# Patient Record
Sex: Male | Born: 1950 | Race: White | Hispanic: No | Marital: Married | Smoking: Never smoker
Health system: Southern US, Community
[De-identification: ages and names within clinical notes are randomized; demographics above are authoritative.]

## PROBLEM LIST (undated history)

## (undated) DIAGNOSIS — M1990 Unspecified osteoarthritis, unspecified site: Secondary | ICD-10-CM

## (undated) DIAGNOSIS — R0600 Dyspnea, unspecified: Secondary | ICD-10-CM

## (undated) DIAGNOSIS — I890 Lymphedema, not elsewhere classified: Secondary | ICD-10-CM

## (undated) DIAGNOSIS — K219 Gastro-esophageal reflux disease without esophagitis: Secondary | ICD-10-CM

## (undated) DIAGNOSIS — J45909 Unspecified asthma, uncomplicated: Secondary | ICD-10-CM

## (undated) DIAGNOSIS — K5790 Diverticulosis of intestine, part unspecified, without perforation or abscess without bleeding: Secondary | ICD-10-CM

## (undated) DIAGNOSIS — M7040 Prepatellar bursitis, unspecified knee: Secondary | ICD-10-CM

## (undated) DIAGNOSIS — J3089 Other allergic rhinitis: Secondary | ICD-10-CM

## (undated) DIAGNOSIS — E119 Type 2 diabetes mellitus without complications: Secondary | ICD-10-CM

## (undated) DIAGNOSIS — G4730 Sleep apnea, unspecified: Secondary | ICD-10-CM

## (undated) DIAGNOSIS — E291 Testicular hypofunction: Secondary | ICD-10-CM

## (undated) DIAGNOSIS — I10 Essential (primary) hypertension: Secondary | ICD-10-CM

## (undated) HISTORY — PX: JOINT REPLACEMENT: SHX530

## (undated) HISTORY — PX: ANKLE SURGERY: SHX546

## (undated) HISTORY — PX: EYE SURGERY: SHX253

## (undated) HISTORY — PX: LEG SURGERY: SHX1003

---

## 2009-08-29 ENCOUNTER — Ambulatory Visit: Payer: Self-pay | Admitting: Gastroenterology

## 2009-10-30 ENCOUNTER — Ambulatory Visit: Payer: Self-pay

## 2013-01-16 ENCOUNTER — Ambulatory Visit: Payer: Self-pay | Admitting: Family Medicine

## 2013-01-27 ENCOUNTER — Ambulatory Visit: Payer: Self-pay | Admitting: Family Medicine

## 2013-02-26 ENCOUNTER — Ambulatory Visit: Payer: Self-pay | Admitting: Family Medicine

## 2013-06-29 ENCOUNTER — Other Ambulatory Visit: Payer: Self-pay

## 2013-06-29 LAB — CBC WITH DIFFERENTIAL/PLATELET
Basophil #: 0.1 10*3/uL (ref 0.0–0.1)
Basophil %: 1 %
EOS ABS: 0.3 10*3/uL (ref 0.0–0.7)
EOS PCT: 4.1 %
HCT: 39.4 % — AB (ref 40.0–52.0)
HGB: 13.3 g/dL (ref 13.0–18.0)
LYMPHS PCT: 21.6 %
Lymphocyte #: 1.5 10*3/uL (ref 1.0–3.6)
MCH: 36 pg — ABNORMAL HIGH (ref 26.0–34.0)
MCHC: 33.8 g/dL (ref 32.0–36.0)
MCV: 107 fL — ABNORMAL HIGH (ref 80–100)
MONO ABS: 0.6 x10 3/mm (ref 0.2–1.0)
Monocyte %: 9.3 %
NEUTROS ABS: 4.4 10*3/uL (ref 1.4–6.5)
Neutrophil %: 64 %
Platelet: 289 10*3/uL (ref 150–440)
RBC: 3.7 10*6/uL — ABNORMAL LOW (ref 4.40–5.90)
RDW: 13.1 % (ref 11.5–14.5)
WBC: 6.8 10*3/uL (ref 3.8–10.6)

## 2013-06-29 LAB — LIPID PANEL
Cholesterol: 140 mg/dL (ref 0–200)
HDL Cholesterol: 53 mg/dL (ref 40–60)
Ldl Cholesterol, Calc: 70 mg/dL (ref 0–100)
TRIGLYCERIDES: 86 mg/dL (ref 0–200)
VLDL Cholesterol, Calc: 17 mg/dL (ref 5–40)

## 2013-06-29 LAB — COMPREHENSIVE METABOLIC PANEL
ANION GAP: 5 — AB (ref 7–16)
Albumin: 3.9 g/dL (ref 3.4–5.0)
Alkaline Phosphatase: 98 U/L
BILIRUBIN TOTAL: 0.5 mg/dL (ref 0.2–1.0)
BUN: 21 mg/dL — ABNORMAL HIGH (ref 7–18)
CHLORIDE: 105 mmol/L (ref 98–107)
CO2: 27 mmol/L (ref 21–32)
Calcium, Total: 9.1 mg/dL (ref 8.5–10.1)
Creatinine: 0.86 mg/dL (ref 0.60–1.30)
EGFR (African American): >60
EGFR (Non-African Amer.): >60
Glucose: 106 mg/dL — ABNORMAL HIGH (ref 65–99)
OSMOLALITY: 277 (ref 275–301)
POTASSIUM: 3.6 mmol/L (ref 3.5–5.1)
SGOT(AST): 26 U/L (ref 15–37)
SGPT (ALT): 44 U/L (ref 12–78)
SODIUM: 137 mmol/L (ref 136–145)
Total Protein: 7.5 g/dL (ref 6.4–8.2)

## 2013-06-29 LAB — HEMOGLOBIN A1C: HEMOGLOBIN A1C: 6.1 % (ref 4.2–6.3)

## 2014-05-15 ENCOUNTER — Ambulatory Visit: Payer: Self-pay | Admitting: Family Medicine

## 2014-09-20 ENCOUNTER — Ambulatory Visit: Payer: 59 | Admitting: Anesthesiology

## 2014-09-20 ENCOUNTER — Encounter: Payer: Self-pay | Admitting: *Deleted

## 2014-09-20 ENCOUNTER — Ambulatory Visit
Admission: RE | Admit: 2014-09-20 | Discharge: 2014-09-20 | Disposition: A | Discharge status: Home / Self Care | Payer: 59 | Source: Ambulatory Visit | Attending: Gastroenterology | Admitting: Gastroenterology

## 2014-09-20 ENCOUNTER — Encounter
Admission: RE | Disposition: A | Discharge status: Home / Self Care | Payer: Self-pay | Source: Ambulatory Visit | Attending: Gastroenterology

## 2014-09-20 DIAGNOSIS — D125 Benign neoplasm of sigmoid colon: Secondary | ICD-10-CM | POA: Diagnosis not present

## 2014-09-20 DIAGNOSIS — Z1211 Encounter for screening for malignant neoplasm of colon: Secondary | ICD-10-CM | POA: Insufficient documentation

## 2014-09-20 DIAGNOSIS — Z79899 Other long term (current) drug therapy: Secondary | ICD-10-CM | POA: Diagnosis not present

## 2014-09-20 DIAGNOSIS — K5730 Diverticulosis of large intestine without perforation or abscess without bleeding: Secondary | ICD-10-CM | POA: Insufficient documentation

## 2014-09-20 DIAGNOSIS — Z7982 Long term (current) use of aspirin: Secondary | ICD-10-CM | POA: Diagnosis not present

## 2014-09-20 DIAGNOSIS — E119 Type 2 diabetes mellitus without complications: Secondary | ICD-10-CM | POA: Insufficient documentation

## 2014-09-20 DIAGNOSIS — K635 Polyp of colon: Secondary | ICD-10-CM | POA: Diagnosis not present

## 2014-09-20 DIAGNOSIS — I10 Essential (primary) hypertension: Secondary | ICD-10-CM | POA: Insufficient documentation

## 2014-09-20 HISTORY — DX: Essential (primary) hypertension: I10

## 2014-09-20 HISTORY — PX: COLONOSCOPY WITH PROPOFOL: SHX5780

## 2014-09-20 HISTORY — DX: Type 2 diabetes mellitus without complications: E11.9

## 2014-09-20 LAB — GLUCOSE, CAPILLARY: Glucose-Capillary: 90 mg/dL (ref 65–99)

## 2014-09-20 SURGERY — COLONOSCOPY WITH PROPOFOL
Anesthesia: General

## 2014-09-20 MED ORDER — PHENYLEPHRINE HCL 10 MG/ML IJ SOLN
INTRAMUSCULAR | Status: DC | PRN
  Administered 2014-09-20 (×4): 100 ug via INTRAVENOUS

## 2014-09-20 MED ORDER — LIDOCAINE HCL (CARDIAC) 20 MG/ML IV SOLN
INTRAVENOUS | Status: DC | PRN
  Administered 2014-09-20: 60 mg via INTRAVENOUS

## 2014-09-20 MED ORDER — EPHEDRINE SULFATE 50 MG/ML IJ SOLN
INTRAMUSCULAR | Status: DC | PRN
  Administered 2014-09-20: 5 mg via INTRAVENOUS

## 2014-09-20 MED ORDER — PROPOFOL INFUSION 10 MG/ML OPTIME
INTRAVENOUS | Status: DC | PRN
  Administered 2014-09-20: 140 ug/kg/min via INTRAVENOUS

## 2014-09-20 MED ORDER — PROPOFOL 10 MG/ML IV BOLUS
INTRAVENOUS | Status: DC | PRN
  Administered 2014-09-20: 50 mg via INTRAVENOUS

## 2014-09-20 MED ORDER — SODIUM CHLORIDE 0.9 % IV SOLN: INTRAVENOUS | Status: DC

## 2014-09-20 MED ORDER — SODIUM CHLORIDE 0.9 % IV SOLN
INTRAVENOUS | Status: DC
  Administered 2014-09-20: 08:00:00 via INTRAVENOUS

## 2014-09-20 NOTE — H&P (Signed)
Outpatient short stay form Pre-procedure 09/20/2014 8:08 AM Lollie Sails MD  Primary Physician: [facility] [place]  Reason for visit:  Colonoscopy  History of present illness:  Personal history colon polyps. Patient is 63 year old male presenting today for colonoscopy. His last colonoscopy was 08/29/2009 that time with adenomatous colon polyp removed. No family history of colon cancer. He does not take any anticoagulation medications and discontinue his aspirin's several days ago.    Current facility-administered medications:  .  0.9 %  sodium chloride infusion, , Intravenous, Continuous, Lollie Sails, MD .  0.9 %  sodium chloride infusion, , Intravenous, Continuous, Lollie Sails, MD  Prescriptions prior to admission  Medication Sig Dispense Refill Last Dose  . albuterol (PROVENTIL HFA;VENTOLIN HFA) 108 (90 BASE) MCG/ACT inhaler Inhale 2 puffs into the lungs every 6 (six) hours as needed for wheezing or shortness of breath.   09/19/2014  . amLODipine-atorvastatin (CADUET) 5-20 MG per tablet Take 1 tablet by mouth daily.   09/20/2014 at 0200  . aspirin EC 81 MG tablet Take 81 mg by mouth daily.   09/19/2014  . budesonide-formoterol (SYMBICORT) 160-4.5 MCG/ACT inhaler Inhale 2 puffs into the lungs 2 (two) times daily.   09/19/2014  . lisinopril-hydrochlorothiazide (PRINZIDE,ZESTORETIC) 20-12.5 MG per tablet Take 1 tablet by mouth daily.   09/19/2014  . metFORMIN (GLUMETZA) 500 MG (MOD) 24 hr tablet Take 1,500 mg by mouth every evening.   09/18/2014  . Multiple Vitamin (MULTIVITAMIN) tablet Take 1 tablet by mouth daily.   09/19/2014  . Omega 3 1000 MG CAPS Take 1 capsule by mouth 2 (two) times daily.   09/19/2014  . omeprazole (PRILOSEC) 20 MG capsule Take 20 mg by mouth daily.   09/19/2014  . Testosterone (ANDROGEL PUMP) 20.25 MG/ACT (1.62%) GEL Place 1 Dose onto the skin daily.   09/19/2014     Allergies  Allergen Reactions  . Sulfa Antibiotics Rash    'flu-like'  symptoms     Past Medical History  Diagnosis Date  . Hypertension   . Diabetes mellitus without complication     Review of systems:      Physical Exam    Heart and lungs: Regular rate and rhythm without rub or gallop lungs are bilaterally clear    HEENT: Normocephalic atraumatic eyes are anicteric    Other:     Pertinant exam for procedure: Soft nontender nondistended bowel sounds positive normoactive active. Slight protuberant    Planned proceedures: Colonoscopy and indicated procedures I have discussed the risks benefits and complications of procedures to include not limited to bleeding, infection, perforation and the risk of sedation and the patient wishes to proceed.    Lollie Sails, MD Gastroenterology 09/20/2014  8:08 AM

## 2014-09-20 NOTE — Transfer of Care (Signed)
Immediate Anesthesia Transfer of Care Note  Patient: Benjamin Nicholson  Procedure(s) Performed: Procedure(s): COLONOSCOPY WITH PROPOFOL (N/A)  Patient Location: Endoscopy Unit  Anesthesia Type:General  Level of Consciousness: awake, alert , oriented and patient cooperative  Airway & Oxygen Therapy: Patient Spontanous Breathing and Patient connected to nasal cannula oxygen  Post-op Assessment: Report given to RN, Post -op Vital signs reviewed and stable and Patient moving all extremities X 4  Post vital signs: Reviewed and stable  Last Vitals:  Filed Vitals:   09/20/14 0857  BP: 132/74  Pulse: 88  Temp: 36.1 C  Resp: 19    Complications: No apparent anesthesia complications

## 2014-09-20 NOTE — Anesthesia Postprocedure Evaluation (Signed)
  Anesthesia Post-op Note  Patient: Benjamin Nicholson  Procedure(s) Performed: Procedure(s): COLONOSCOPY WITH PROPOFOL (N/A)  Anesthesia type:General  Patient location: PACU  Post pain: Pain level controlled  Post assessment: Post-op Vital signs reviewed, Patient's Cardiovascular Status Stable, Respiratory Function Stable, Patent Airway and No signs of Nausea or vomiting  Post vital signs: Reviewed and stable  Last Vitals:  Filed Vitals:   09/20/14 0859  BP: 132/74  Pulse:   Temp: 36.1 C  Resp: 16    Level of consciousness: awake, alert  and patient cooperative  Complications: No apparent anesthesia complications

## 2014-09-20 NOTE — Op Note (Signed)
[facility] Gastroenterology Patient Name: Benjamin Nicholson Procedure Date: 09/20/2014 7:52 AM MRN: [id number] Account #: [id number] Date of Birth: Nov 02, 1950 Admit Type: Outpatient Age: 64 Room: [facility] ENDO ROOM 4 Gender: Male Note Status: Finalized Procedure:         Colonoscopy Indications:       Personal history of colonic polyps Providers:         Lollie Sails, MD Referring MD:      Laurice Record. Manor, MD (Referring MD) Medicines:         Monitored Anesthesia Care Complications:     No immediate complications. Procedure:         Pre-Anesthesia Assessment:                    - ASA Grade Assessment: II - A patient with mild systemic                     disease.                    After obtaining informed consent, the colonoscope was                     passed under direct vision. Throughout the procedure, the                     patient's blood pressure, pulse, and oxygen saturations                     were monitored continuously. The Colonoscope was                     introduced through the anus and advanced to the the cecum,                     identified by appendiceal orifice and ileocecal valve. The                     colonoscopy was performed without difficulty. The patient                     tolerated the procedure well. The quality of the bowel                     preparation was good. Findings:      A 6 mm polyp was found at the appendiceal orifice. The polyp was       sessile. The polyp was removed with a cold biopsy forceps. The polyp was       removed with a piecemeal technique using a cold biopsy forceps. The       polyp was removed with a cold snare. The polyp was removed with a lift       and cut technique using a cold snare. Resection and retrieval were       complete.      A 2 mm polyp was found in the proximal sigmoid colon. The polyp was       sessile. The polyp was removed with a cold biopsy forceps. Resection and       retrieval were  complete.      Multiple small-mouthed diverticula were found in the sigmoid colon and       in the descending colon.      The retroflexed view of the distal rectum and anal verge was normal  and       showed no anal or rectal abnormalities.      The digital rectal exam was normal. Impression:        - One 6 mm polyp at the appendiceal orifice. Resected and                     retrieved.                    - One 2 mm polyp in the proximal sigmoid colon. Resected                     and retrieved.                    - Diverticulosis in the sigmoid colon and in the                     descending colon.                    - The distal rectum and anal verge are normal on                     retroflexion view. Recommendation:    - Discharge patient to home.                    - Await pathology results.                    - Telephone GI clinic for pathology results in 1 week. Procedure Code(s): --- Professional ---                    [phone], Colonoscopy, flexible; with removal of tumor(s),                     polyp(s), or other lesion(s) by snare technique                    45380, 42, Colonoscopy, flexible; with biopsy, single or                     multiple Diagnosis Code(s): --- Professional ---                    211.3, Benign neoplasm of colon                    V12.72, Personal history of colonic polyps                    562.10, Diverticulosis of colon (without mention of                     hemorrhage) CPT copyright 2014 American Medical Association. All rights reserved. The codes documented in this report are preliminary and upon coder review may  be revised to meet current compliance requirements. Lollie Sails, MD 09/20/2014 8:52:07 AM This report has been signed electronically. Number of Addenda: 0 Note Initiated On: 09/20/2014 7:52 AM Scope Withdrawal Time: 0 hours 21 minutes 20 seconds  Total Procedure Duration: 0 hours 27 minutes 7 seconds       [facility]

## 2014-09-20 NOTE — Anesthesia Preprocedure Evaluation (Addendum)
Anesthesia Evaluation  Patient identified by MRN, date of birth, ID band Patient awake    Reviewed: Allergy & Precautions, NPO status , Patient's Chart, lab work & pertinent test results  Airway Mallampati: II       Dental  (+) Caps Crown R upper back molar:   Pulmonary COPD COPD inhaler,  breath sounds clear to auscultation  Pulmonary exam normal       Cardiovascular hypertension, Normal cardiovascular exam    Neuro/Psych negative neurological ROS  negative psych ROS   GI/Hepatic GERD-  Medicated and Controlled,Hx of colon polyps   Endo/Other  diabetes, Well Controlled, Type 2, Oral Hypoglycemic Agents  Renal/GU negative Renal ROS  negative genitourinary   Musculoskeletal negative musculoskeletal ROS (+)   Abdominal Normal abdominal exam  (+)   Peds negative pediatric ROS (+)  Hematology negative hematology ROS (+)   Anesthesia Other Findings   Reproductive/Obstetrics                           Anesthesia Physical Anesthesia Plan  ASA: III  Anesthesia Plan: General   Post-op Pain Management:    Induction: Intravenous  Airway Management Planned: Nasal Cannula  Additional Equipment:   Intra-op Plan:   Post-operative Plan:   Informed Consent: I have reviewed the patients History and Physical, chart, labs and discussed the procedure including the risks, benefits and alternatives for the proposed anesthesia with the patient or authorized representative who has indicated his/her understanding and acceptance.   Dental advisory given  Plan Discussed with: CRNA and Surgeon  Anesthesia Plan Comments:         Anesthesia Quick Evaluation

## 2014-09-23 ENCOUNTER — Encounter: Payer: Self-pay | Admitting: Gastroenterology

## 2014-09-24 LAB — SURGICAL PATHOLOGY

## 2014-11-08 DIAGNOSIS — K219 Gastro-esophageal reflux disease without esophagitis: Secondary | ICD-10-CM | POA: Insufficient documentation

## 2014-11-08 DIAGNOSIS — E119 Type 2 diabetes mellitus without complications: Secondary | ICD-10-CM | POA: Insufficient documentation

## 2014-11-08 DIAGNOSIS — G479 Sleep disorder, unspecified: Secondary | ICD-10-CM | POA: Insufficient documentation

## 2014-11-08 DIAGNOSIS — J4530 Mild persistent asthma, uncomplicated: Secondary | ICD-10-CM | POA: Insufficient documentation

## 2014-11-08 DIAGNOSIS — I10 Essential (primary) hypertension: Secondary | ICD-10-CM | POA: Insufficient documentation

## 2014-11-08 DIAGNOSIS — E291 Testicular hypofunction: Secondary | ICD-10-CM | POA: Insufficient documentation

## 2015-04-18 ENCOUNTER — Ambulatory Visit: Payer: Self-pay | Admitting: Physician Assistant

## 2015-04-18 ENCOUNTER — Encounter: Payer: Self-pay | Admitting: Physician Assistant

## 2015-04-18 VITALS — BP 160/98 | HR 80 | Temp 98.5°F

## 2015-04-18 DIAGNOSIS — R21 Rash and other nonspecific skin eruption: Secondary | ICD-10-CM

## 2015-04-18 MED ORDER — METHYLPREDNISOLONE 4 MG PO TBPK: ORAL_TABLET | ORAL | Status: DC

## 2015-04-18 NOTE — Progress Notes (Signed)
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

## 2015-06-02 DIAGNOSIS — H524 Presbyopia: Secondary | ICD-10-CM | POA: Diagnosis not present

## 2015-06-02 DIAGNOSIS — E119 Type 2 diabetes mellitus without complications: Secondary | ICD-10-CM | POA: Diagnosis not present

## 2015-08-13 DIAGNOSIS — E291 Testicular hypofunction: Secondary | ICD-10-CM | POA: Diagnosis not present

## 2015-08-13 DIAGNOSIS — J4530 Mild persistent asthma, uncomplicated: Secondary | ICD-10-CM | POA: Diagnosis not present

## 2015-08-13 DIAGNOSIS — I10 Essential (primary) hypertension: Secondary | ICD-10-CM | POA: Diagnosis not present

## 2015-08-13 DIAGNOSIS — E119 Type 2 diabetes mellitus without complications: Secondary | ICD-10-CM | POA: Diagnosis not present

## 2015-10-13 DIAGNOSIS — M25562 Pain in left knee: Secondary | ICD-10-CM | POA: Insufficient documentation

## 2015-11-07 ENCOUNTER — Other Ambulatory Visit: Payer: Self-pay | Admitting: Pharmacist

## 2015-11-07 NOTE — Patient Outreach (Signed)
Called patient to schedule employee sponsored link to wellness program visit.  Patient has not been seen in 2017.  Left message for patient to return my call.  Will follow up.   Bennye Alm, PharmD [facility] PGY2 Pharmacy Resident [phone]

## 2015-11-10 ENCOUNTER — Other Ambulatory Visit: Payer: Self-pay | Admitting: Pharmacist

## 2015-11-10 NOTE — Patient Outreach (Signed)
Called patient to schedule a Employee Link to Wellness followup visit as he has not been seen in 2017.  Left voicemail on patients home and cell phone to return my call and schedule an appointment.  Will plan to retry outreach one more time prior to discharge from the program.    Bennye Alm, PharmD [facility] PGY2 Pharmacy Resident [phone]

## 2015-11-28 ENCOUNTER — Encounter: Payer: Self-pay | Admitting: Pharmacist

## 2015-11-28 ENCOUNTER — Other Ambulatory Visit: Payer: Self-pay | Admitting: Pharmacist

## 2015-11-28 NOTE — Patient Outreach (Signed)
[place] [facility]) Care Management  [place]   11/28/2015  Benjamin Nicholson April 28, 1950 [id number]  Subjective: Patient presents today for diabetes follow-up as part of the employer-sponsored Link to Wellness program.  Current diabetes regimen includes metformin 1500 mg daily.  Patient also continues on daily aspirin, lisinopril and  atorvastatin.  Most recent MD follow-up was 08/13/15 with Dr Sherron Flemings.  Patient has a pending appt for 01/13/16.  No med changes or major health changes at this time.  He states adherence with his medications.   Patient reported dietary habits: Eats 2-3 meals/day Breakfast:Eggs with orange juice shot + seltzer water Dinner:Cheeseburger Snacks:usually leftovers Drinks:V8 juice States he avoids sweets  Patient reported exercise habits: Previously has done martial arts training but hasn't been doing exercise recently due to knee pain. Is thinking about doing punching bag again for his exercise.   Patient denies hypoglycemic events.  Patient reports nocturia once per day.  Patient denies pain/burning upon urination.  Patient reports neuropathy if sitting when knees bent or driving long distances. Per his primary care it is more postional rather than neuropathy Patient denies visual changes. Patient reports self foot exams. Denies problems  Patient reported self monitored blood glucose frequency daily or a couple times a week Home fasting CBG: Did not bring meter but reports values 90-140   Objective:  POCT glycosylated hemoglobin (Hb A1C) (08/13/2015 11:31 AM) POCT glycosylated hemoglobin (Hb A1C) (08/13/2015 11:31 AM)  Component Value Ref Range  Hemoglobin A1C 6.3 (A) 4 - 6 %   Lipid Panel (11/08/2014 9:18 AM) Lipid Panel (11/08/2014 9:18 AM)  Component Value Ref Range  Cholesterol 188 100 - 199 mg/dL  Triglycerides 128 1 - 149 mg/dL  HDL 78 (H) 40 - 59 mg/dL  LDL Calculated 84     Encounter Medications: Outpatient Encounter  Prescriptions as of 11/28/2015  Medication Sig  . albuterol (PROVENTIL HFA;VENTOLIN HFA) 108 (90 BASE) MCG/ACT inhaler Inhale 2 puffs into the lungs every 6 (six) hours as needed for wheezing or shortness of breath.  Marland Kitchen amLODipine-atorvastatin (CADUET) 5-20 MG per tablet Take 1 tablet by mouth daily.  Marland Kitchen aspirin EC 81 MG tablet Take 81 mg by mouth daily.  . budesonide-formoterol (SYMBICORT) 160-4.5 MCG/ACT inhaler Inhale 2 puffs into the lungs 2 (two) times daily.  . Lactobacillus (PROBIOTIC ACIDOPHILUS PO) Take 2 tablets by mouth every evening.  Marland Kitchen lisinopril-hydrochlorothiazide (PRINZIDE,ZESTORETIC) 20-25 MG tablet Take 1 tablet by mouth daily.  . metFORMIN (GLUMETZA) 500 MG (MOD) 24 hr tablet Take 1,500 mg by mouth every evening.  . mirtazapine (REMERON) 15 MG tablet Take 1 tablet by mouth at bedtime as needed.  . NONFORMULARY OR COMPOUNDED ITEM Take 4 tablets by mouth 2 (two) times daily. Alkalete  . omeprazole (PRILOSEC) 20 MG capsule Take 20 mg by mouth daily.  . Testosterone (ANDROGEL PUMP) 20.25 MG/ACT (1.62%) GEL Place 1 Dose onto the skin daily.  Marland Kitchen testosterone (ANDROGEL) 50 MG/5GM (1%) GEL Apply 1 application topically every morning.  Marland Kitchen VIAGRA 50 MG tablet Take 50 mg by mouth as needed.   . [DISCONTINUED] lisinopril-hydrochlorothiazide (PRINZIDE,ZESTORETIC) 20-12.5 MG per tablet Take 1 tablet by mouth daily.  . [DISCONTINUED] methylPREDNISolone (MEDROL DOSEPAK) 4 MG TBPK tablet Take 6 pills on day one then decrease by 1 pill each day  . [DISCONTINUED] Multiple Vitamin (MULTIVITAMIN) tablet Take 1 tablet by mouth daily.  . [DISCONTINUED] Omega 3 1000 MG CAPS Take 1 capsule by mouth 2 (two) times daily. Reported on 04/18/2015  No facility-administered encounter medications on file as of 11/28/2015.     Functional Status: In your present state of health, do you have any difficulty performing the following activities: 11/28/2015  Hearing? Y  Vision? N  Difficulty concentrating or making  decisions? N  Walking or climbing stairs? Y  Dressing or bathing? N  Doing errands, shopping? N  Some recent data might be hidden    Fall/Depression Screening: PHQ 2/9 Scores 11/28/2015  PHQ - 2 Score 0     Assessment:  Diabetes: Most recent A1C was 6.3% which is at at goal of less than 7%.   Lifestyle improvements: Physical Activity- Limited by knee pain but would like to start doing the punching bag in his martial arts Nutrition- States he adheres to a moderate carbohydrate diet most of the time   Plan/Goals for Next Visit: -Discussed low carbohydrate and exercise goal of >150 minutes/week of mild-moderate exercise and strategies to exercise without hurting his knees.  -Patient set goal to start using punching bag with martial arts trainer over the next 90 days as measured by patient report   Next appointment to see me is: 3 months telephonic  Bennye Alm, PharmD [facility] PGY2 Pharmacy Resident [phone]  [facility] CM Care Plan Problem One   Flowsheet Row Most Recent Value  Care Plan Problem One  Nonadherence to Exercise related to diabetes management  Role Documenting the Problem One  Clinical Pharmacist  Care Plan for Problem One  Active  [facility] Long Term Goal (31-90 days)  start using punching bag with martial arts trainer over the next 90 days as measured by patient report  [place] Term Goal Start Date  11/28/15  Interventions for Problem One Long Term Goal  Discussed low carbohydrate and exercise goal of >150 minutes/week of mild-moderate exercise and strategies to exercise without hurting his knees.

## 2016-01-09 DIAGNOSIS — M7042 Prepatellar bursitis, left knee: Secondary | ICD-10-CM | POA: Diagnosis not present

## 2016-01-09 DIAGNOSIS — M1712 Unilateral primary osteoarthritis, left knee: Secondary | ICD-10-CM | POA: Insufficient documentation

## 2016-01-13 DIAGNOSIS — D7589 Other specified diseases of blood and blood-forming organs: Secondary | ICD-10-CM | POA: Diagnosis not present

## 2016-01-13 DIAGNOSIS — E119 Type 2 diabetes mellitus without complications: Secondary | ICD-10-CM | POA: Diagnosis not present

## 2016-01-13 DIAGNOSIS — M170 Bilateral primary osteoarthritis of knee: Secondary | ICD-10-CM | POA: Diagnosis not present

## 2016-01-13 DIAGNOSIS — E291 Testicular hypofunction: Secondary | ICD-10-CM | POA: Diagnosis not present

## 2016-01-13 DIAGNOSIS — Z23 Encounter for immunization: Secondary | ICD-10-CM | POA: Diagnosis not present

## 2016-01-13 DIAGNOSIS — I10 Essential (primary) hypertension: Secondary | ICD-10-CM | POA: Diagnosis not present

## 2016-01-22 DIAGNOSIS — M1711 Unilateral primary osteoarthritis, right knee: Secondary | ICD-10-CM | POA: Diagnosis not present

## 2016-02-25 DIAGNOSIS — Z86010 Personal history of colonic polyps: Secondary | ICD-10-CM | POA: Diagnosis not present

## 2016-02-27 ENCOUNTER — Ambulatory Visit: Payer: Self-pay | Admitting: Pharmacist

## 2016-05-13 DIAGNOSIS — M1711 Unilateral primary osteoarthritis, right knee: Secondary | ICD-10-CM | POA: Diagnosis not present

## 2016-05-13 DIAGNOSIS — M1712 Unilateral primary osteoarthritis, left knee: Secondary | ICD-10-CM | POA: Diagnosis not present

## 2016-05-13 DIAGNOSIS — M25561 Pain in right knee: Secondary | ICD-10-CM | POA: Diagnosis not present

## 2016-05-13 DIAGNOSIS — M25562 Pain in left knee: Secondary | ICD-10-CM | POA: Diagnosis not present

## 2016-06-07 ENCOUNTER — Encounter: Payer: Self-pay | Admitting: *Deleted

## 2016-06-08 ENCOUNTER — Encounter: Admission: RE | Payer: Self-pay | Source: Ambulatory Visit

## 2016-06-08 ENCOUNTER — Ambulatory Visit: Admission: RE | Admit: 2016-06-08 | Payer: 59 | Source: Ambulatory Visit | Admitting: Gastroenterology

## 2016-06-08 HISTORY — DX: Unspecified asthma, uncomplicated: J45.909

## 2016-06-08 HISTORY — DX: Testicular hypofunction: E29.1

## 2016-06-08 HISTORY — DX: Gastro-esophageal reflux disease without esophagitis: K21.9

## 2016-06-08 HISTORY — DX: Sleep apnea, unspecified: G47.30

## 2016-06-08 HISTORY — DX: Diverticulosis of intestine, part unspecified, without perforation or abscess without bleeding: K57.90

## 2016-06-08 HISTORY — DX: Unspecified osteoarthritis, unspecified site: M19.90

## 2016-06-08 HISTORY — DX: Prepatellar bursitis, unspecified knee: M70.40

## 2016-06-08 SURGERY — COLONOSCOPY WITH PROPOFOL
Anesthesia: General

## 2016-07-09 DIAGNOSIS — E782 Mixed hyperlipidemia: Secondary | ICD-10-CM | POA: Diagnosis not present

## 2016-07-09 DIAGNOSIS — E119 Type 2 diabetes mellitus without complications: Secondary | ICD-10-CM | POA: Diagnosis not present

## 2016-07-09 DIAGNOSIS — I10 Essential (primary) hypertension: Secondary | ICD-10-CM | POA: Diagnosis not present

## 2016-07-13 ENCOUNTER — Other Ambulatory Visit: Payer: Self-pay

## 2016-07-13 NOTE — Patient Outreach (Signed)
[place] [facility]) Care Management  07/13/2016  LINLEY MOXLEY 1950-09-18 [id number]  Subjective: none.  Objective: Per chart review, patient with unilateral primary osteoarthritis, left knee-scheduled for computer Assisted Total Knee Arthroplasty on 07/26/16. History of DM, HTN, Asthma, GERD.   Assessment:  Received [facility] Pre-surgical call referral on 07/09/16. Telephone call to patient's home / mobile number, no answer. HIPPA compliant voice message left. Pre-surgical screen pending patient contact.   Plan: RNCM will call patient for 2nd telephone outreach attempt within the week if no return call.  Thea Silversmith, RN, MSN, [place] Coordinator Cell: [phone]

## 2016-07-13 NOTE — Patient Outreach (Signed)
[place] [facility]) Care Management  07/13/2016  Benjamin Nicholson [birth year] [id number]   Subjective: Telephone call to patient. Discussed UMR pre-op follow up. Patient voices understanding and agrees to pre-op call.    Objective: per chart review-Patient with Unilateral primary osteoarthritis, left knee-scheduled for Computer assisted Total Knee Arthroplasty on 07/26/16. History of DM, HTN, asthma, GERD.  Assessment: Received [facility]MR Pre-surgical call referral on 07/09/16.  RNCM received return call. Benjamin Nicholson reports he is retired and has [employer], therefore FMLA, Short-term/Long Term disability does not apply to him.   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 Client reports he has some equipment. RNCM reinforced that the inpatient case manager in the hospital will arrange if he has any of these needs prior to discharge.   RNCM discussed [place] benefit is higher when using a [place] facility/pharmacy. Client voiced understanding.    Support: Client reports he has transportation to his follow up appointment. Client also reports that he has someone to assist with pharmacy needs at discharge.     Discussed Advanced Directives. RNCM reinforced that Spiritual care department available to assist [employer] employees with Advanced Directives as needed.  No other medical issues identified and no additional community resource information needs at this time.   Thea Silversmith, RN, MSN, [place] Coordinator Cell: [phone]

## 2016-07-14 ENCOUNTER — Encounter
Admission: RE | Admit: 2016-07-14 | Discharge: 2016-07-14 | Disposition: A | Discharge status: Home / Self Care | Payer: 59 | Source: Ambulatory Visit | Attending: Orthopedic Surgery | Admitting: Orthopedic Surgery

## 2016-07-14 DIAGNOSIS — Z01810 Encounter for preprocedural cardiovascular examination: Secondary | ICD-10-CM | POA: Insufficient documentation

## 2016-07-14 DIAGNOSIS — Z01812 Encounter for preprocedural laboratory examination: Secondary | ICD-10-CM | POA: Insufficient documentation

## 2016-07-14 DIAGNOSIS — I10 Essential (primary) hypertension: Secondary | ICD-10-CM | POA: Diagnosis not present

## 2016-07-14 HISTORY — DX: Other allergic rhinitis: J30.89

## 2016-07-14 HISTORY — DX: Dyspnea, unspecified: R06.00

## 2016-07-14 LAB — URINALYSIS, ROUTINE W REFLEX MICROSCOPIC
Bilirubin Urine: NEGATIVE
GLUCOSE, UA: NEGATIVE mg/dL
HGB URINE DIPSTICK: NEGATIVE
KETONES UR: 5 mg/dL — AB
LEUKOCYTES UA: NEGATIVE
Nitrite: NEGATIVE
PROTEIN: NEGATIVE mg/dL
Specific Gravity, Urine: 1.013 (ref 1.005–1.030)
pH: 6 (ref 5.0–8.0)

## 2016-07-14 LAB — COMPREHENSIVE METABOLIC PANEL
ALBUMIN: 4.7 g/dL (ref 3.5–5.0)
ALT: 41 U/L (ref 17–63)
AST: 28 U/L (ref 15–41)
Alkaline Phosphatase: 61 U/L (ref 38–126)
Anion gap: 12 (ref 5–15)
BUN: 28 mg/dL — ABNORMAL HIGH (ref 6–20)
CALCIUM: 10.1 mg/dL (ref 8.9–10.3)
CHLORIDE: 100 mmol/L — AB (ref 101–111)
CO2: 25 mmol/L (ref 22–32)
CREATININE: 1.01 mg/dL (ref 0.61–1.24)
GFR calc non Af Amer: >60 mL/min (ref >60)
GLUCOSE: 110 mg/dL — AB (ref 65–99)
Potassium: 3.9 mmol/L (ref 3.5–5.1)
Sodium: 137 mmol/L (ref 135–145)
Total Bilirubin: 0.7 mg/dL (ref 0.3–1.2)
Total Protein: 8.1 g/dL (ref 6.5–8.1)

## 2016-07-14 LAB — APTT: APTT: 26 s (ref 24–36)

## 2016-07-14 LAB — CBC
HCT: 40.7 % (ref 40.0–52.0)
Hemoglobin: 14 g/dL (ref 13.0–18.0)
MCH: 35.5 pg — AB (ref 26.0–34.0)
MCHC: 34.3 g/dL (ref 32.0–36.0)
MCV: 103.4 fL — ABNORMAL HIGH (ref 80.0–100.0)
PLATELETS: 302 10*3/uL (ref 150–440)
RBC: 3.94 MIL/uL — ABNORMAL LOW (ref 4.40–5.90)
RDW: 13.5 % (ref 11.5–14.5)
WBC: 6.5 10*3/uL (ref 3.8–10.6)

## 2016-07-14 LAB — TYPE AND SCREEN
ABO/RH(D): A POS
Antibody Screen: NEGATIVE

## 2016-07-14 LAB — SURGICAL PCR SCREEN
MRSA, PCR: NEGATIVE
Staphylococcus aureus: POSITIVE — AB

## 2016-07-14 LAB — C-REACTIVE PROTEIN

## 2016-07-14 LAB — PROTIME-INR
INR: 0.89
Prothrombin Time: 12 seconds (ref 11.4–15.2)

## 2016-07-14 LAB — SEDIMENTATION RATE: Sed Rate: 31 mm/hr — ABNORMAL HIGH (ref 0–20)

## 2016-07-14 NOTE — Patient Instructions (Signed)
Your procedure is scheduled on: 07/26/16 Mon Report to Same Day Surgery 2nd floor medical mall [facility] Entrance-take elevator on left to 2nd floor.  Check in with surgery information desk.) To find out your arrival time please call [phone] between 1PM - 3PM on 07/23/16 Fri Remember: Instructions that are not followed completely may result in serious medical risk, up to and including death, or upon the discretion of your surgeon and anesthesiologist your surgery may need to be rescheduled.    _x___ 1. Do not eat food or drink liquids after midnight. No gum chewing or                              hard candies.     __x__ 2. No Alcohol for 24 hours before or after surgery.   __x__3. No Smoking for 24 prior to surgery.   ____  4. Bring all medications with you on the day of surgery if instructed.    __x__ 5. Notify your doctor if there is any change in your medical condition     (cold, fever, infections).     Do not wear jewelry, make-up, hairpins, clips or nail polish.  Do not wear lotions, powders, or perfumes. You may wear deodorant.  Do not shave 48 hours prior to surgery. Men may shave face and neck.  Do not bring valuables to the hospital.    [facility] is not responsible for any belongings or valuables.               Contacts, dentures or bridgework may not be worn into surgery.  Leave your suitcase in the car. After surgery it may be brought to your room.  For patients admitted to the hospital, discharge time is determined by your                       treatment team.   Patients discharged the day of surgery will not be allowed to drive home.  You will need someone to drive you home and stay with you the night of your procedure.    Please read over the following fact sheets that you were given:   [facility] Preparing for Surgery and or MRSA Information   _x___ Take anti-hypertensive (unless it includes a diuretic), cardiac, seizure, asthma,     anti-reflux and  psychiatric medicines. These include:  1. albuterol (PROVENTIL   2.budesonide-formoterol (SYMBICORT)   3.omeprazole (PRILOSEC)  4.  5.  6.  ____Fleets enema or Magnesium Citrate as directed.   _x___ Use CHG Soap or sage wipes as directed on instruction sheet   ____ Use inhalers on the day of surgery and bring to hospital day of surgery  _x___ Stop Metformin and Janumet 2 days prior to surgery.    ____ Take 1/2 of usual insulin dose the night before surgery and none on the morning     surgery.   _x___ Follow recommendations from Cardiologist, Pulmonologist or PCP regarding          stopping Aspirin, Coumadin, Pllavix ,Eliquis, Effient, or Pradaxa, and Pletal.  X____Stop Anti-inflammatories such as Advil, Aleve, Ibuprofen, Motrin, Naproxen, Naprosyn, Goodies powders or aspirin products. OK to take Tylenol and                          Celebrex.   _x___ Stop supplements until after surgery.  But may continue  Vitamin D, Vitamin B,       and multivitamin.   ____ Bring C-Pap to the hospital.

## 2016-07-15 LAB — HEMOGLOBIN A1C
HEMOGLOBIN A1C: 6.4 % — AB (ref 4.8–5.6)
MEAN PLASMA GLUCOSE: 137 mg/dL

## 2016-07-15 LAB — URINE CULTURE
CULTURE: NO GROWTH
SPECIAL REQUESTS: NORMAL

## 2016-07-15 NOTE — Pre-Procedure Instructions (Signed)
Hgb A1C sent to Dr. Marry Guan for review.

## 2016-07-22 DIAGNOSIS — R05 Cough: Secondary | ICD-10-CM | POA: Diagnosis not present

## 2016-07-25 MED ORDER — TRANEXAMIC ACID 1000 MG/10ML IV SOLN
1000.0000 mg | INTRAVENOUS | Status: AC
  Administered 2016-07-26: 1000 mg via INTRAVENOUS
  Filled 2016-07-25: qty 10

## 2016-07-25 MED ORDER — CEFAZOLIN SODIUM-DEXTROSE 2-4 GM/100ML-% IV SOLN
2.0000 g | INTRAVENOUS | Status: AC
  Administered 2016-07-26: 2 g via INTRAVENOUS

## 2016-07-26 ENCOUNTER — Encounter
Admission: AD | Disposition: A | Discharge status: Home Health | Payer: Self-pay | Source: Ambulatory Visit | Attending: Orthopedic Surgery

## 2016-07-26 ENCOUNTER — Encounter: Payer: Self-pay | Admitting: Orthopedic Surgery

## 2016-07-26 ENCOUNTER — Inpatient Hospital Stay: Payer: 59

## 2016-07-26 ENCOUNTER — Ambulatory Visit: Payer: 59 | Admitting: Anesthesiology

## 2016-07-26 ENCOUNTER — Inpatient Hospital Stay
Admission: AD | Admit: 2016-07-26 | Discharge: 2016-07-28 | DRG: 470 | Disposition: A | Discharge status: Home Health | Payer: 59 | Source: Ambulatory Visit | Attending: Orthopedic Surgery | Admitting: Orthopedic Surgery

## 2016-07-26 DIAGNOSIS — Z833 Family history of diabetes mellitus: Secondary | ICD-10-CM | POA: Diagnosis not present

## 2016-07-26 DIAGNOSIS — I10 Essential (primary) hypertension: Secondary | ICD-10-CM | POA: Diagnosis present

## 2016-07-26 DIAGNOSIS — Z7984 Long term (current) use of oral hypoglycemic drugs: Secondary | ICD-10-CM

## 2016-07-26 DIAGNOSIS — Z79899 Other long term (current) drug therapy: Secondary | ICD-10-CM

## 2016-07-26 DIAGNOSIS — Z471 Aftercare following joint replacement surgery: Secondary | ICD-10-CM | POA: Diagnosis not present

## 2016-07-26 DIAGNOSIS — J45909 Unspecified asthma, uncomplicated: Secondary | ICD-10-CM | POA: Diagnosis present

## 2016-07-26 DIAGNOSIS — E119 Type 2 diabetes mellitus without complications: Secondary | ICD-10-CM | POA: Diagnosis present

## 2016-07-26 DIAGNOSIS — K219 Gastro-esophageal reflux disease without esophagitis: Secondary | ICD-10-CM | POA: Diagnosis not present

## 2016-07-26 DIAGNOSIS — Z7951 Long term (current) use of inhaled steroids: Secondary | ICD-10-CM | POA: Diagnosis not present

## 2016-07-26 DIAGNOSIS — Z803 Family history of malignant neoplasm of breast: Secondary | ICD-10-CM | POA: Diagnosis not present

## 2016-07-26 DIAGNOSIS — M1712 Unilateral primary osteoarthritis, left knee: Principal | ICD-10-CM | POA: Diagnosis present

## 2016-07-26 DIAGNOSIS — Z96652 Presence of left artificial knee joint: Secondary | ICD-10-CM | POA: Diagnosis not present

## 2016-07-26 DIAGNOSIS — G4730 Sleep apnea, unspecified: Secondary | ICD-10-CM | POA: Diagnosis present

## 2016-07-26 DIAGNOSIS — Z96659 Presence of unspecified artificial knee joint: Secondary | ICD-10-CM

## 2016-07-26 HISTORY — PX: KNEE ARTHROPLASTY: SHX992

## 2016-07-26 LAB — GLUCOSE, CAPILLARY
GLUCOSE-CAPILLARY: 123 mg/dL — AB (ref 65–99)
GLUCOSE-CAPILLARY: 164 mg/dL — AB (ref 65–99)
Glucose-Capillary: 136 mg/dL — ABNORMAL HIGH (ref 65–99)
Glucose-Capillary: 211 mg/dL — ABNORMAL HIGH (ref 65–99)

## 2016-07-26 LAB — ABO/RH: ABO/RH(D): A POS

## 2016-07-26 SURGERY — ARTHROPLASTY, KNEE, TOTAL, USING IMAGELESS COMPUTER-ASSISTED NAVIGATION
Anesthesia: Spinal | Laterality: Left

## 2016-07-26 MED ORDER — ACETAMINOPHEN 10 MG/ML IV SOLN
INTRAVENOUS | Status: AC
  Filled 2016-07-26: qty 100

## 2016-07-26 MED ORDER — PROPOFOL 500 MG/50ML IV EMUL
INTRAVENOUS | Status: AC
  Filled 2016-07-26: qty 50

## 2016-07-26 MED ORDER — SODIUM CHLORIDE 0.9 % IV SOLN
INTRAVENOUS | Status: DC
  Administered 2016-07-26 (×2): via INTRAVENOUS

## 2016-07-26 MED ORDER — PHENOL 1.4 % MT LIQD: 1.0000 | OROMUCOSAL | Status: DC | PRN

## 2016-07-26 MED ORDER — CHLORHEXIDINE GLUCONATE 4 % EX LIQD: 60.0000 mL | Freq: Once | CUTANEOUS | Status: DC

## 2016-07-26 MED ORDER — PROPOFOL 10 MG/ML IV BOLUS
INTRAVENOUS | Status: DC | PRN
  Administered 2016-07-26 (×6): 19 mg via INTRAVENOUS

## 2016-07-26 MED ORDER — DIPHENHYDRAMINE HCL 12.5 MG/5ML PO ELIX: 12.5000 mg | ORAL_SOLUTION | ORAL | Status: DC | PRN

## 2016-07-26 MED ORDER — ONDANSETRON HCL 4 MG PO TABS: 4.0000 mg | ORAL_TABLET | Freq: Four times a day (QID) | ORAL | Status: DC | PRN

## 2016-07-26 MED ORDER — FLUTICASONE PROPIONATE 50 MCG/ACT NA SUSP: 1.0000 | Freq: Every day | NASAL | Status: DC | PRN

## 2016-07-26 MED ORDER — KETAMINE HCL 50 MG/ML IJ SOLN
INTRAMUSCULAR | Status: AC
  Filled 2016-07-26: qty 10

## 2016-07-26 MED ORDER — ACETAMINOPHEN 325 MG PO TABS: 650.0000 mg | ORAL_TABLET | Freq: Four times a day (QID) | ORAL | Status: DC | PRN

## 2016-07-26 MED ORDER — TESTOSTERONE 50 MG/5GM (1%) TD GEL: 2.5000 g | Freq: Every day | TRANSDERMAL | Status: DC

## 2016-07-26 MED ORDER — DEXAMETHASONE SODIUM PHOSPHATE 4 MG/ML IJ SOLN
INTRAMUSCULAR | Status: DC | PRN
  Administered 2016-07-26: 5 mg via INTRAVENOUS

## 2016-07-26 MED ORDER — BUPIVACAINE HCL (PF) 0.25 % IJ SOLN
INTRAMUSCULAR | Status: AC
  Filled 2016-07-26: qty 60

## 2016-07-26 MED ORDER — SODIUM CHLORIDE 0.9 % IJ SOLN
INTRAMUSCULAR | Status: DC | PRN
  Administered 2016-07-26: 40 mL

## 2016-07-26 MED ORDER — CEFAZOLIN SODIUM-DEXTROSE 2-4 GM/100ML-% IV SOLN
INTRAVENOUS | Status: AC
  Filled 2016-07-26: qty 100

## 2016-07-26 MED ORDER — BUPIVACAINE HCL (PF) 0.5 % IJ SOLN
INTRAMUSCULAR | Status: DC | PRN
  Administered 2016-07-26: 3 mL via INTRATHECAL

## 2016-07-26 MED ORDER — ENOXAPARIN SODIUM 30 MG/0.3ML ~~LOC~~ SOLN
30.0000 mg | Freq: Two times a day (BID) | SUBCUTANEOUS | Status: DC
  Administered 2016-07-27 – 2016-07-28 (×3): 30 mg via SUBCUTANEOUS
  Filled 2016-07-26 (×3): qty 0.3

## 2016-07-26 MED ORDER — PANTOPRAZOLE SODIUM 40 MG PO TBEC
40.0000 mg | DELAYED_RELEASE_TABLET | Freq: Two times a day (BID) | ORAL | Status: DC
  Administered 2016-07-26 – 2016-07-28 (×4): 40 mg via ORAL
  Filled 2016-07-26 (×4): qty 1

## 2016-07-26 MED ORDER — INSULIN ASPART 100 UNIT/ML ~~LOC~~ SOLN
0.0000 [IU] | Freq: Three times a day (TID) | SUBCUTANEOUS | Status: DC
  Administered 2016-07-26: 5 [IU] via SUBCUTANEOUS
  Administered 2016-07-27 – 2016-07-28 (×3): 2 [IU] via SUBCUTANEOUS
  Filled 2016-07-26 (×2): qty 2
  Filled 2016-07-26: qty 5

## 2016-07-26 MED ORDER — RISAQUAD PO CAPS
2.0000 | ORAL_CAPSULE | Freq: Every evening | ORAL | Status: DC
  Administered 2016-07-26 – 2016-07-27 (×2): 2 via ORAL
  Filled 2016-07-26 (×2): qty 2

## 2016-07-26 MED ORDER — MIDAZOLAM HCL 5 MG/5ML IJ SOLN
INTRAMUSCULAR | Status: DC | PRN
  Administered 2016-07-26: 2 mg via INTRAVENOUS

## 2016-07-26 MED ORDER — ALUM & MAG HYDROXIDE-SIMETH 200-200-20 MG/5ML PO SUSP: 30.0000 mL | ORAL | Status: DC | PRN

## 2016-07-26 MED ORDER — SODIUM CHLORIDE 0.9 % IV SOLN
INTRAVENOUS | Status: DC | PRN
  Administered 2016-07-26: 25 ug/min via INTRAVENOUS

## 2016-07-26 MED ORDER — SODIUM CHLORIDE 0.9 % IV SOLN
INTRAVENOUS | Status: DC | PRN
  Administered 2016-07-26: 60 mL

## 2016-07-26 MED ORDER — FENTANYL CITRATE (PF) 100 MCG/2ML IJ SOLN: 25.0000 ug | INTRAMUSCULAR | Status: DC | PRN

## 2016-07-26 MED ORDER — METOCLOPRAMIDE HCL 10 MG PO TABS
10.0000 mg | ORAL_TABLET | Freq: Three times a day (TID) | ORAL | Status: DC
  Administered 2016-07-26 – 2016-07-28 (×6): 10 mg via ORAL
  Filled 2016-07-26 (×6): qty 1

## 2016-07-26 MED ORDER — FERROUS SULFATE 325 (65 FE) MG PO TABS
325.0000 mg | ORAL_TABLET | Freq: Two times a day (BID) | ORAL | Status: DC
  Administered 2016-07-26 – 2016-07-28 (×4): 325 mg via ORAL
  Filled 2016-07-26 (×4): qty 1

## 2016-07-26 MED ORDER — ACETAMINOPHEN 10 MG/ML IV SOLN
1000.0000 mg | Freq: Four times a day (QID) | INTRAVENOUS | Status: AC
  Administered 2016-07-26 – 2016-07-27 (×4): 1000 mg via INTRAVENOUS
  Filled 2016-07-26 (×4): qty 100

## 2016-07-26 MED ORDER — SENNOSIDES-DOCUSATE SODIUM 8.6-50 MG PO TABS
1.0000 | ORAL_TABLET | Freq: Two times a day (BID) | ORAL | Status: DC
  Administered 2016-07-26 – 2016-07-28 (×4): 1 via ORAL
  Filled 2016-07-26 (×4): qty 1

## 2016-07-26 MED ORDER — ONDANSETRON HCL 4 MG/2ML IJ SOLN: 4.0000 mg | Freq: Four times a day (QID) | INTRAMUSCULAR | Status: DC | PRN

## 2016-07-26 MED ORDER — ADULT MULTIVITAMIN W/MINERALS CH
0.5000 | ORAL_TABLET | Freq: Two times a day (BID) | ORAL | Status: DC
  Administered 2016-07-26 – 2016-07-28 (×4): 0.5 via ORAL
  Filled 2016-07-26 (×4): qty 1

## 2016-07-26 MED ORDER — BISACODYL 10 MG RE SUPP: 10.0000 mg | Freq: Every day | RECTAL | Status: DC | PRN

## 2016-07-26 MED ORDER — MAGNESIUM HYDROXIDE 400 MG/5ML PO SUSP
30.0000 mL | Freq: Every day | ORAL | Status: DC | PRN
  Administered 2016-07-26 – 2016-07-27 (×2): 30 mL via ORAL
  Filled 2016-07-26 (×2): qty 30

## 2016-07-26 MED ORDER — MIDAZOLAM HCL 2 MG/2ML IJ SOLN
INTRAMUSCULAR | Status: AC
  Filled 2016-07-26: qty 2

## 2016-07-26 MED ORDER — CALCIUM CARBONATE ANTACID 500 MG PO CHEW: 2.0000 | CHEWABLE_TABLET | Freq: Every day | ORAL | Status: DC | PRN

## 2016-07-26 MED ORDER — CEFAZOLIN SODIUM-DEXTROSE 2-4 GM/100ML-% IV SOLN: 2.0000 g | Freq: Four times a day (QID) | INTRAVENOUS | Status: DC

## 2016-07-26 MED ORDER — LISINOPRIL-HYDROCHLOROTHIAZIDE 20-25 MG PO TABS: 1.0000 | ORAL_TABLET | Freq: Every day | ORAL | Status: DC

## 2016-07-26 MED ORDER — SODIUM CHLORIDE 0.9 % IJ SOLN
INTRAMUSCULAR | Status: AC
  Filled 2016-07-26: qty 50

## 2016-07-26 MED ORDER — ALBUTEROL SULFATE (2.5 MG/3ML) 0.083% IN NEBU: 2.5000 mg | INHALATION_SOLUTION | Freq: Four times a day (QID) | RESPIRATORY_TRACT | Status: DC | PRN

## 2016-07-26 MED ORDER — KETAMINE HCL 50 MG/ML IJ SOLN
INTRAMUSCULAR | Status: DC | PRN
  Administered 2016-07-26 (×3): 1.9 mg via INTRAMUSCULAR
  Administered 2016-07-26: 25 mg via INTRAMUSCULAR
  Administered 2016-07-26 (×3): 1.9 mg via INTRAMUSCULAR

## 2016-07-26 MED ORDER — LORATADINE 10 MG PO TABS: 10.0000 mg | ORAL_TABLET | Freq: Every day | ORAL | Status: DC | PRN

## 2016-07-26 MED ORDER — MORPHINE SULFATE (PF) 2 MG/ML IV SOLN
2.0000 mg | INTRAVENOUS | Status: DC | PRN
  Administered 2016-07-27: 2 mg via INTRAVENOUS
  Filled 2016-07-26: qty 1

## 2016-07-26 MED ORDER — AMLODIPINE BESYLATE 5 MG PO TABS
5.0000 mg | ORAL_TABLET | Freq: Every day | ORAL | Status: DC
  Administered 2016-07-27: 5 mg via ORAL
  Filled 2016-07-26: qty 1

## 2016-07-26 MED ORDER — HYDROCHLOROTHIAZIDE 25 MG PO TABS
25.0000 mg | ORAL_TABLET | Freq: Every day | ORAL | Status: DC
  Administered 2016-07-27 – 2016-07-28 (×2): 25 mg via ORAL
  Filled 2016-07-26 (×2): qty 1

## 2016-07-26 MED ORDER — NEOMYCIN-POLYMYXIN B GU 40-200000 IR SOLN
Status: AC
  Filled 2016-07-26: qty 20

## 2016-07-26 MED ORDER — TRANEXAMIC ACID 1000 MG/10ML IV SOLN
1000.0000 mg | Freq: Once | INTRAVENOUS | Status: AC
  Administered 2016-07-26: 1000 mg via INTRAVENOUS
  Filled 2016-07-26: qty 10

## 2016-07-26 MED ORDER — MOMETASONE FURO-FORMOTEROL FUM 200-5 MCG/ACT IN AERO
2.0000 | INHALATION_SPRAY | Freq: Two times a day (BID) | RESPIRATORY_TRACT | Status: DC
  Administered 2016-07-26 – 2016-07-28 (×4): 2 via RESPIRATORY_TRACT
  Filled 2016-07-26: qty 8.8

## 2016-07-26 MED ORDER — BUPIVACAINE LIPOSOME 1.3 % IJ SUSP
INTRAMUSCULAR | Status: AC
  Filled 2016-07-26: qty 20

## 2016-07-26 MED ORDER — PROPOFOL 500 MG/50ML IV EMUL
INTRAVENOUS | Status: DC | PRN
  Administered 2016-07-26: 70 ug/kg/min via INTRAVENOUS

## 2016-07-26 MED ORDER — DEXTROSE 5 % IV SOLN
2.0000 g | Freq: Four times a day (QID) | INTRAVENOUS | Status: AC
  Administered 2016-07-26 – 2016-07-27 (×4): 2 g via INTRAVENOUS
  Filled 2016-07-26 (×4): qty 2000

## 2016-07-26 MED ORDER — GLYCOPYRROLATE 0.2 MG/ML IJ SOLN
INTRAMUSCULAR | Status: DC | PRN
Start: 2016-07-26 — End: 2016-07-26
  Administered 2016-07-26: 0.1 mg via INTRAVENOUS
  Administered 2016-07-26: 0.3 mg via INTRAVENOUS

## 2016-07-26 MED ORDER — SODIUM CHLORIDE 0.9 % IV SOLN
INTRAVENOUS | Status: DC | PRN
  Administered 2016-07-26: 7 ug/kg/min via INTRAVENOUS

## 2016-07-26 MED ORDER — OXYCODONE HCL 5 MG PO TABS
5.0000 mg | ORAL_TABLET | ORAL | Status: DC | PRN
  Administered 2016-07-26 – 2016-07-27 (×5): 5 mg via ORAL
  Administered 2016-07-27 (×2): 10 mg via ORAL
  Administered 2016-07-28 (×2): 5 mg via ORAL
  Filled 2016-07-26: qty 1
  Filled 2016-07-26: qty 2
  Filled 2016-07-26 (×4): qty 1
  Filled 2016-07-26: qty 2
  Filled 2016-07-26 (×2): qty 1

## 2016-07-26 MED ORDER — ACETAMINOPHEN 10 MG/ML IV SOLN
INTRAVENOUS | Status: DC | PRN
Start: 2016-07-26 — End: 2016-07-26
  Administered 2016-07-26: 1000 mg via INTRAVENOUS

## 2016-07-26 MED ORDER — ALBUTEROL SULFATE HFA 108 (90 BASE) MCG/ACT IN AERS: 2.0000 | INHALATION_SPRAY | Freq: Four times a day (QID) | RESPIRATORY_TRACT | Status: DC | PRN

## 2016-07-26 MED ORDER — LISINOPRIL 20 MG PO TABS
20.0000 mg | ORAL_TABLET | Freq: Every day | ORAL | Status: DC
Start: 2016-07-27 — End: 2016-07-28
  Administered 2016-07-27 – 2016-07-28 (×2): 20 mg via ORAL
  Filled 2016-07-26 (×2): qty 1

## 2016-07-26 MED ORDER — ATORVASTATIN CALCIUM 20 MG PO TABS
20.0000 mg | ORAL_TABLET | Freq: Every day | ORAL | Status: DC
  Administered 2016-07-26 – 2016-07-27 (×2): 20 mg via ORAL
  Filled 2016-07-26 (×2): qty 1

## 2016-07-26 MED ORDER — FLEET ENEMA 7-19 GM/118ML RE ENEM: 1.0000 | ENEMA | Freq: Once | RECTAL | Status: DC | PRN

## 2016-07-26 MED ORDER — GLYCOPYRROLATE 0.2 MG/ML IJ SOLN
INTRAMUSCULAR | Status: AC
  Filled 2016-07-26: qty 1

## 2016-07-26 MED ORDER — NEOMYCIN-POLYMYXIN B GU 40-200000 IR SOLN
Status: DC | PRN
  Administered 2016-07-26: 14 mL

## 2016-07-26 MED ORDER — MENTHOL 3 MG MT LOZG: 1.0000 | LOZENGE | OROMUCOSAL | Status: DC | PRN

## 2016-07-26 MED ORDER — BUPIVACAINE HCL (PF) 0.25 % IJ SOLN
INTRAMUSCULAR | Status: DC | PRN
  Administered 2016-07-26: 60 mL

## 2016-07-26 MED ORDER — ACETAMINOPHEN 650 MG RE SUPP: 650.0000 mg | Freq: Four times a day (QID) | RECTAL | Status: DC | PRN

## 2016-07-26 MED ORDER — PHENYLEPHRINE HCL 10 MG/ML IJ SOLN
INTRAMUSCULAR | Status: AC
  Filled 2016-07-26: qty 1

## 2016-07-26 MED ORDER — SODIUM CHLORIDE 0.9 % IV SOLN
INTRAVENOUS | Status: DC
  Administered 2016-07-26: via INTRAVENOUS

## 2016-07-26 MED ORDER — DEXAMETHASONE SODIUM PHOSPHATE 10 MG/ML IJ SOLN
INTRAMUSCULAR | Status: AC
  Filled 2016-07-26: qty 1

## 2016-07-26 MED ORDER — LIDOCAINE HCL (PF) 2 % IJ SOLN
INTRAMUSCULAR | Status: AC
  Filled 2016-07-26: qty 2

## 2016-07-26 MED ORDER — TRAMADOL HCL 50 MG PO TABS
50.0000 mg | ORAL_TABLET | ORAL | Status: DC | PRN
Start: 2016-07-26 — End: 2016-07-28
  Administered 2016-07-26: 50 mg via ORAL
  Administered 2016-07-27 (×2): 100 mg via ORAL
  Administered 2016-07-27 – 2016-07-28 (×3): 50 mg via ORAL
  Administered 2016-07-28 (×2): 100 mg via ORAL
  Filled 2016-07-26 (×2): qty 1
  Filled 2016-07-26: qty 2
  Filled 2016-07-26 (×2): qty 1
  Filled 2016-07-26 (×3): qty 2

## 2016-07-26 MED ORDER — FENTANYL CITRATE (PF) 100 MCG/2ML IJ SOLN
INTRAMUSCULAR | Status: AC
  Filled 2016-07-26: qty 2

## 2016-07-26 MED ORDER — METFORMIN HCL ER 750 MG PO TB24
1500.0000 mg | ORAL_TABLET | Freq: Every day | ORAL | Status: DC
  Filled 2016-07-26: qty 2

## 2016-07-26 MED ORDER — FENTANYL CITRATE (PF) 100 MCG/2ML IJ SOLN
INTRAMUSCULAR | Status: DC | PRN
  Administered 2016-07-26: 50 ug via INTRAVENOUS

## 2016-07-26 MED ORDER — ONDANSETRON HCL 4 MG/2ML IJ SOLN: 4.0000 mg | Freq: Once | INTRAMUSCULAR | Status: DC | PRN

## 2016-07-26 SURGICAL SUPPLY — 70 items
BATTERY INSTRU NAVIGATION (MISCELLANEOUS) ×12 IMPLANT
BLADE SAW 1 (BLADE) ×3 IMPLANT
BLADE SAW 1/2 (BLADE) ×3 IMPLANT
BLADE SAW 70X12.5 (BLADE) IMPLANT
BONE CEMENT GENTAMICIN (Cement) ×6 IMPLANT
CANISTER SUCT 1200ML W/VALVE (MISCELLANEOUS) ×3 IMPLANT
CANISTER SUCT 3000ML PPV (MISCELLANEOUS) ×6 IMPLANT
CAPT KNEE TOTAL 3 ATTUNE ×3 IMPLANT
CATH TRAY METER 16FR LF (MISCELLANEOUS) ×3 IMPLANT
CEMENT BONE GENTAMICIN 40 (Cement) ×2 IMPLANT
CLIPPER SURGICAL HAIR RECHARGE (MISCELLANEOUS)
COOLER POLAR GLACIER W/PUMP (MISCELLANEOUS) ×3 IMPLANT
CUFF TOURN 24 STER (MISCELLANEOUS) IMPLANT
CUFF TOURN 30 STER DUAL PORT (MISCELLANEOUS) ×3 IMPLANT
DRAPE SHEET LG 3/4 BI-LAMINATE (DRAPES) ×3 IMPLANT
DRSG DERMACEA 8X12 NADH (GAUZE/BANDAGES/DRESSINGS) ×3 IMPLANT
DRSG OPSITE POSTOP 4X14 (GAUZE/BANDAGES/DRESSINGS) ×3 IMPLANT
DRSG TEGADERM 4X4.75 (GAUZE/BANDAGES/DRESSINGS) ×3 IMPLANT
DURAPREP 26ML APPLICATOR (WOUND CARE) ×6 IMPLANT
ELECT CAUTERY BLADE 6.4 (BLADE) ×3 IMPLANT
ELECT REM PT RETURN 9FT ADLT (ELECTROSURGICAL) ×3
ELECTRODE REM PT RTRN 9FT ADLT (ELECTROSURGICAL) ×1 IMPLANT
EX-PIN ORTHOLOCK NAV 4X150 (PIN) ×6 IMPLANT
GLOVE BIO SURGEON STRL SZ 6.5 (GLOVE) ×4 IMPLANT
GLOVE BIO SURGEONS STRL SZ 6.5 (GLOVE) ×2
GLOVE BIOGEL M STRL SZ7.5 (GLOVE) ×6 IMPLANT
GLOVE BIOGEL PI IND STRL 7.0 (GLOVE) ×1 IMPLANT
GLOVE BIOGEL PI IND STRL 9 (GLOVE) ×1 IMPLANT
GLOVE BIOGEL PI INDICATOR 7.0 (GLOVE) ×2
GLOVE BIOGEL PI INDICATOR 9 (GLOVE) ×2
GLOVE INDICATOR 7.5 STRL GRN (GLOVE) ×3 IMPLANT
GLOVE INDICATOR 8.0 STRL GRN (GLOVE) ×3 IMPLANT
GLOVE SURG SYN 9.0  PF PI (GLOVE) ×2
GLOVE SURG SYN 9.0 PF PI (GLOVE) ×1 IMPLANT
GOWN STRL REUS W/ TWL LRG LVL3 (GOWN DISPOSABLE) ×3 IMPLANT
GOWN STRL REUS W/TWL 2XL LVL3 (GOWN DISPOSABLE) ×3 IMPLANT
GOWN STRL REUS W/TWL LRG LVL3 (GOWN DISPOSABLE) ×6
HEMOVAC 400CC 10FR (MISCELLANEOUS) ×3 IMPLANT
HOLDER FOLEY CATH W/STRAP (MISCELLANEOUS) ×3 IMPLANT
HOOD PEEL AWAY FLYTE STAYCOOL (MISCELLANEOUS) ×6 IMPLANT
KIT RM TURNOVER STRD PROC AR (KITS) ×3 IMPLANT
KNIFE SCULPS 14X20 (INSTRUMENTS) ×3 IMPLANT
LABEL OR SOLS (LABEL) ×3 IMPLANT
NDL SAFETY 18GX1.5 (NEEDLE) ×3 IMPLANT
NEEDLE SPNL 20GX3.5 QUINCKE YW (NEEDLE) ×3 IMPLANT
NS IRRIG 500ML POUR BTL (IV SOLUTION) ×3 IMPLANT
PACK TOTAL KNEE (MISCELLANEOUS) ×3 IMPLANT
PAD WRAPON POLAR KNEE (MISCELLANEOUS) ×1 IMPLANT
PIN DRILL QUICK PACK ×3 IMPLANT
PIN FIXATION 1/8DIA X 3INL (PIN) ×3 IMPLANT
PULSAVAC PLUS IRRIG FAN TIP (DISPOSABLE) ×3
SOL .9 NS 3000ML IRR  AL (IV SOLUTION) ×2
SOL .9 NS 3000ML IRR UROMATIC (IV SOLUTION) ×1 IMPLANT
SOL PREP PVP 2OZ (MISCELLANEOUS) ×3
SOLUTION PREP PVP 2OZ (MISCELLANEOUS) ×1 IMPLANT
SPONGE DRAIN TRACH 4X4 STRL 2S (GAUZE/BANDAGES/DRESSINGS) ×3 IMPLANT
STAPLER SKIN PROX 35W (STAPLE) ×3 IMPLANT
STRAP TIBIA SHORT (MISCELLANEOUS) ×2 IMPLANT
SUCTION FRAZIER HANDLE 10FR (MISCELLANEOUS) ×2
SUCTION TUBE FRAZIER 10FR DISP (MISCELLANEOUS) ×1 IMPLANT
SURGICAL HAIR CLIPPER RECHARGE (MISCELLANEOUS) IMPLANT
SUT VIC AB 0 CT1 36 (SUTURE) ×3 IMPLANT
SUT VIC AB 1 CT1 36 (SUTURE) ×6 IMPLANT
SUT VIC AB 2-0 CT2 27 (SUTURE) ×3 IMPLANT
SYR 20CC LL (SYRINGE) ×3 IMPLANT
SYR 30ML LL (SYRINGE) ×6 IMPLANT
TIP FAN IRRIG PULSAVAC PLUS (DISPOSABLE) ×1 IMPLANT
TOWEL OR 17X26 4PK STRL BLUE (TOWEL DISPOSABLE) ×3 IMPLANT
TOWER CARTRIDGE SMART MIX (DISPOSABLE) ×3 IMPLANT
WRAPON POLAR PAD KNEE (MISCELLANEOUS) ×3

## 2016-07-26 NOTE — Anesthesia Procedure Notes (Signed)
Spinal  Patient location during procedure: OR Start time: 07/26/2016 7:25 AM End time: 07/26/2016 7:38 AM Staffing Performed: anesthesiologist  Preanesthetic Checklist Completed: patient identified, site marked, surgical consent, pre-op evaluation, timeout performed, IV checked, risks and benefits discussed and monitors and equipment checked Spinal Block Patient position: sitting Prep: Betadine Patient monitoring: heart rate, continuous pulse ox, blood pressure and cardiac monitor Approach: midline Location: L4-5 Injection technique: single-shot Needle Needle type: Whitacre and Introducer  Needle gauge: 24 G Needle length: 9 cm Additional Notes Negative paresthesia. Negative blood return. Positive free-flowing CSF. Expiration date of kit checked and confirmed. Patient tolerated procedure well, without complications.

## 2016-07-26 NOTE — H&P (Signed)
The patient has been re-examined, and the chart reviewed, and there have been no interval changes to the documented history and physical.    The risks, benefits, and alternatives have been discussed at length. The patient expressed understanding of the risks benefits and agreed with plans for surgical intervention.  James P. Hooten, Jr. M.D.    

## 2016-07-26 NOTE — Progress Notes (Signed)
Patient was admitted to room 155 from OR. A&O x4. Foley, Hemovac, TEDs, cyro cuff, and foot pumps in place. Bed alarm on for safety. IV fluids started. Reviewed POC and orders. Allowed time for questions. Wife at bedside.

## 2016-07-26 NOTE — Anesthesia Preprocedure Evaluation (Signed)
Anesthesia Evaluation  Patient identified by MRN, date of birth, ID band Patient awake    Reviewed: Allergy & Precautions, NPO status , Patient's Chart, lab work & pertinent test results  History of Anesthesia Complications Negative for: history of anesthetic complications  Airway Mallampati: II       Dental   Pulmonary asthma , sleep apnea , Recent URI , Residual Cough,           Cardiovascular hypertension, Pt. on medications      Neuro/Psych negative neurological ROS     GI/Hepatic Neg liver ROS, GERD  Medicated and Poorly Controlled,  Endo/Other  diabetes, Oral Hypoglycemic Agents  Renal/GU negative Renal ROS     Musculoskeletal  (+) Arthritis ,   Abdominal   Peds  Hematology negative hematology ROS (+)   Anesthesia Other Findings   Reproductive/Obstetrics                             Anesthesia Physical Anesthesia Plan  ASA: III  Anesthesia Plan: Spinal   Post-op Pain Management:    Induction:   Airway Management Planned: Nasal Cannula  Additional Equipment:   Intra-op Plan:   Post-operative Plan:   Informed Consent: I have reviewed the patients History and Physical, chart, labs and discussed the procedure including the risks, benefits and alternatives for the proposed anesthesia with the patient or authorized representative who has indicated his/her understanding and acceptance.     Plan Discussed with:   Anesthesia Plan Comments:         Anesthesia Quick Evaluation

## 2016-07-26 NOTE — Evaluation (Signed)
Physical Therapy Evaluation Patient Details Name: Benjamin Nicholson MRN: [id number] DOB: 06/26/1950 Today's Date: 07/26/2016   History of Present Illness  Pt is a 66 y/o M s/p L TKA.  Pt's PMH includes prepatellar bursitis.  Clinical Impression  Pt is s/p L TKA resulting in the deficits listed below (see PT Problem List). Mr. Geurin had an excellent start to physical therapy, ambulating 215 ft using RW with min guard assist.  His L knee ROM is -4 deg L knee extension AROM and 91 deg L knee flexion with AAROM. He tolerated all interventions well and is motivated to increase his independence.  He was Ind with all aspects of mobility PTA, using cane only on uneven surfaces.  He will have intermittent assist available from his wife and father in law at d/c.  Pt will benefit from skilled PT to increase their independence and safety with mobility to allow discharge to the venue listed below.     Follow Up Recommendations Home health PT    Equipment Recommendations  None recommended by PT    Recommendations for Other Services OT consult     Precautions / Restrictions Precautions Precautions: Fall Restrictions Weight Bearing Restrictions: Yes LLE Weight Bearing: Weight bearing as tolerated      Mobility  Bed Mobility Overal bed mobility: Needs Assistance Bed Mobility: Supine to Sit     Supine to sit: Supervision;HOB elevated     General bed mobility comments: HOB slightly elevated and pt requires increased time and effort.  Pt initially uses LUE to assist LLE to EOB.  Pt exited bed on R to simulate home environment.    Transfers Overall transfer level: Needs assistance Equipment used: Rolling walker (2 wheeled) Transfers: Sit to/from Stand Sit to Stand: Min guard         General transfer comment: Pt demonstrated proper technique without need for cues for hand placement.  He favors WBing through RLE and rises slowly.  Pt requires cues for controlled descent to sit.    Ambulation/Gait Ambulation/Gait assistance: Min guard Ambulation Distance (Feet): 215 Feet Assistive device: Rolling walker (2 wheeled) Gait Pattern/deviations: Step-to pattern;Step-through pattern;Decreased stride length;Decreased stance time - left;Decreased step length - right;Decreased weight shift to left;Antalgic Gait velocity: decreased Gait velocity interpretation: Below normal speed for age/gender General Gait Details: Pt requires cues to relax shoulder and for forward gaze when ambulating.  Pt transitions from step to pattern to step through gait pattern without cues.  He demonstrates fatigue in RLE toward end of ambulation but does not require any rest breaks.    Stairs            Wheelchair Mobility    Modified Rankin (Stroke Patients Only)       Balance Overall balance assessment: Needs assistance Sitting-balance support: No upper extremity supported;Feet supported Sitting balance-Leahy Scale: Good     Standing balance support: Bilateral upper extremity supported;During functional activity Standing balance-Leahy Scale: Poor Standing balance comment: Relies on RW for static and dynamic activities                             Pertinent Vitals/Pain Pain Assessment: 0-10 Pain Score: 6  Pain Location: L knee Pain Descriptors / Indicators: Grimacing;Guarding;Aching Pain Intervention(s): Limited activity within patient's tolerance;Monitored during session;Repositioned;Other (comment) (Polar ice applied at end of session)    Home Living Family/patient expects to be discharged to:: Private residence Living Arrangements: Spouse/significant other Available Help at Discharge: Family;Available PRN/intermittently (  Father in law next door.  Wife works during the day.) Type of Home: House Home Access: Level entry     [place]: Two level;Able to live on main level with bedroom/bathroom (Is planning to stay in guest bedroom downstairs) Home Equipment:  Gilford Rile - 2 wheels;Shower seat;Hand held shower head;Cane - single point;Bedside commode;Grab bars - tub/shower Additional Comments: Pt has one step inside home when he leaves or enters the guest bedroom he will be staying in.  There is not a grab bar or rail.    Prior Function Level of Independence: Independent with assistive device(s)         Comments: Pt uses cane when ambulating on uneven surfaces.  Otherwise does not use AD.  He is ind with bathing, dressing.     Hand Dominance        Extremity/Trunk Assessment   Upper Extremity Assessment Upper Extremity Assessment: Defer to OT evaluation    Lower Extremity Assessment Lower Extremity Assessment: LLE deficits/detail;RLE deficits/detail RLE Deficits / Details: Pt reports h/o R knee OA but strength WNL LLE Deficits / Details: Limited ROM and strength s/p L TKA.  Pt able to perform SLR witout extension lag.  Strength grossly 3/5.  He reports return of sensation.       Communication   Communication: No difficulties  Cognition Arousal/Alertness: Awake/alert Behavior During Therapy: WFL for tasks assessed/performed Overall Cognitive Status: Within Functional Limits for tasks assessed                                        General Comments General comments (skin integrity, edema, etc.): Pt's wife, [name], present during PT evaluation.    Exercises Total Joint Exercises Ankle Circles/Pumps: AROM;Both;10 reps;Supine Quad Sets: Strengthening;Both;10 reps;Supine;Other (comment) (with 3 second holds) Straight Leg Raises: AROM;Left;5 reps;Supine Long Arc Quad: Strengthening;Left;10 reps;Seated Knee Flexion: AAROM;Left;5 reps;Seated;Other (comment) (with 5 second holds) Goniometric ROM: -4 deg L knee extension AROM.  91 deg L knee flexion with AAROM.   Assessment/Plan    PT Assessment Patient needs continued PT services  PT Problem List Decreased strength;Decreased range of motion;Decreased activity  tolerance;Decreased balance;Decreased knowledge of use of DME;Decreased safety awareness;Pain       PT Treatment Interventions DME instruction;Gait training;Stair training;Functional mobility training;Therapeutic activities;Therapeutic exercise;Balance training;Neuromuscular re-education;Patient/family education;Modalities;Manual techniques    PT Goals (Current goals can be found in the Care Plan section)  Acute Rehab PT Goals Patient Stated Goal: to be able to do more things around the house  PT Goal Formulation: With patient Time For Goal Achievement: 08/09/16 Potential to Achieve Goals: Good    Frequency BID   Barriers to discharge        Co-evaluation               AM-PAC PT "6 Clicks" Daily Activity  Outcome Measure Difficulty turning over in bed (including adjusting bedclothes, sheets and blankets)?: A Lot Difficulty moving from lying on back to sitting on the side of the bed? : A Lot Difficulty sitting down on and standing up from a chair with arms (e.g., wheelchair, bedside commode, etc,.)?: A Lot Help needed moving to and from a bed to chair (including a wheelchair)?: A Little Help needed walking in hospital room?: A Little Help needed climbing 3-5 steps with a railing? : A Little 6 Click Score: 15    End of Session Equipment Utilized During Treatment: Gait belt Activity Tolerance:  Patient tolerated treatment well Patient left: in chair;with call bell/phone within reach;with chair alarm set;with family/visitor present;Other (comment) (with Respiratory Therapist in room) Nurse Communication: Mobility status;Weight bearing status PT Visit Diagnosis: Pain;Unsteadiness on feet (R26.81);Other abnormalities of gait and mobility (R26.89) Pain - Right/Left: Left Pain - part of body: Knee    Time: 7195-9747 PT Time Calculation (min) (ACUTE ONLY): 52 min   Charges:   PT Evaluation $PT Eval Low Complexity: 1 Procedure PT Treatments $Gait Training: 8-22  mins $Therapeutic Exercise: 8-22 mins   PT G Codes:        Collie Siad PT, DPT 07/26/2016, 4:41 PM

## 2016-07-26 NOTE — Transfer of Care (Signed)
Immediate Anesthesia Transfer of Care Note  Patient: Benjamin Nicholson  Procedure(s) Performed: Procedure(s): COMPUTER ASSISTED TOTAL KNEE ARTHROPLASTY (Left)  Patient Location: PACU  Anesthesia Type:Spinal  Level of Consciousness: awake and patient cooperative  Airway & Oxygen Therapy: Patient Spontanous Breathing and Patient connected to nasal cannula oxygen  Post-op Assessment: Report given to RN and Post -op Vital signs reviewed and stable  Post vital signs: Reviewed and stable  Last Vitals:  Vitals:   07/26/16 0607  BP: (!) 171/88  Pulse: 80  Resp: 16  Temp: 36.3 C    Last Pain:  Vitals:   07/26/16 0607  TempSrc: Oral  PainSc: 0-No pain         Complications: No apparent anesthesia complications

## 2016-07-26 NOTE — NC FL2 (Signed)
[place] LEVEL OF CARE SCREENING TOOL     IDENTIFICATION  Patient Name: Benjamin Nicholson Birthdate: 1950/10/17 Sex: male Admission Date (Current Location): 07/26/2016  [place] and [place] Number:  Engineering geologist and Address:  [facility], [address] [place], [place] [address]      Provider Number: [id number]  Attending Physician Name and Address:  Dereck Leep, MD  Relative Name and Phone Number:       Current Level of Care: Hospital Recommended Level of Care: [place] Prior Approval Number:    Date Approved/Denied:   PASRR Number:  [id number])  Discharge Plan: SNF    Current Diagnoses: Patient Active Problem List   Diagnosis Date Noted  . S/P total knee arthroplasty 07/26/2016  . Primary osteoarthritis of left knee 01/09/2016  . Left knee pain 10/13/2015  . Essential hypertension 11/08/2014  . Gastroesophageal reflux disease without esophagitis 11/08/2014  . Hypogonadism in male 11/08/2014  . Mild persistent asthma 11/08/2014  . Sleep disorder 11/08/2014  . Type 2 diabetes mellitus, controlled ([place]) 11/08/2014    Orientation RESPIRATION BLADDER Height & Weight     Self, Time, Situation, Place  Normal Continent Weight: 213 lb (96.6 kg) Height:  5\' 11"  (180.3 cm)  BEHAVIORAL SYMPTOMS/MOOD NEUROLOGICAL BOWEL NUTRITION STATUS   (none)  (none) Continent Diet (Diet: Clear liquid to be advanced. )  AMBULATORY STATUS COMMUNICATION OF NEEDS Skin   Extensive Assist Verbally Surgical wounds (Incision: Left Knee )                       Personal Care Assistance Level of Assistance  Bathing, Feeding, Dressing Bathing Assistance: Limited assistance Feeding assistance: Independent Dressing Assistance: Limited assistance     Functional Limitations Info  Sight, Hearing, Speech Sight Info: Adequate Hearing Info: Adequate Speech Info: Adequate    SPECIAL CARE FACTORS FREQUENCY  PT (By licensed PT),  OT (By licensed OT)     PT Frequency:  (5) OT Frequency:  (5)            Contractures      Additional Factors Info  Code Status, Allergies, Insulin Sliding Scale Code Status Info:  (Full Code. ) Allergies Info:  (Sulfa Antibiotics)   Insulin Sliding Scale Info:  (NovoLog Insulin Injections. )       Current Medications (07/26/2016):  This is the current hospital active medication list Current Facility-Administered Medications  Medication Dose Route Frequency Provider Last Rate Last Dose  . 0.9 %  sodium chloride infusion   Intravenous Continuous Dereck Leep, MD      . acetaminophen (OFIRMEV) IV 1,000 mg  1,000 mg Intravenous Q6H Dereck Leep, MD 400 mL/hr at 07/26/16 1437 1,000 mg at 07/26/16 1437  . acetaminophen (TYLENOL) tablet 650 mg  650 mg Oral Q6H PRN Dereck Leep, MD       Or  . acetaminophen (TYLENOL) suppository 650 mg  650 mg Rectal Q6H PRN Dereck Leep, MD      . acidophilus (RISAQUAD) capsule 2 capsule  2 capsule Oral QPM Dereck Leep, MD      . albuterol (PROVENTIL) (2.5 MG/3ML) 0.083% nebulizer solution 2.5 mg  2.5 mg Nebulization Q6H PRN Dereck Leep, MD      . alum & mag hydroxide-simeth (MAALOX/MYLANTA) 200-200-20 MG/5ML suspension 30 mL  30 mL Oral Q4H PRN Dereck Leep, MD      . amLODipine (NORVASC) tablet 5 mg  5 mg Oral Daily Dereck Leep, MD      . atorvastatin (LIPITOR) tablet 20 mg  20 mg Oral Daily Dereck Leep, MD      . bisacodyl (DULCOLAX) suppository 10 mg  10 mg Rectal Daily PRN Dereck Leep, MD      . calcium carbonate (TUMS - dosed in mg elemental calcium) chewable tablet 400 mg of elemental calcium  2 tablet Oral Daily PRN Dereck Leep, MD      . ceFAZolin (ANCEF) 2 g in dextrose 5 % 100 mL IVPB  2 g Intravenous Q6H Dereck Leep, MD   Stopped at 07/26/16 1346  . diphenhydrAMINE (BENADRYL) 12.5 MG/5ML elixir 12.5-25 mg  12.5-25 mg Oral Q4H PRN Dereck Leep, MD      . Derrill Memo ON 07/27/2016] enoxaparin (LOVENOX) injection 30  mg  30 mg Subcutaneous Q12H Dereck Leep, MD      . ferrous sulfate tablet 325 mg  325 mg Oral BID WC Dereck Leep, MD      . fluticasone (FLONASE) 50 MCG/ACT nasal spray 1 spray  1 spray Each Nare Daily PRN Dereck Leep, MD      . Derrill Memo ON 07/27/2016] lisinopril (PRINIVIL,ZESTRIL) tablet 20 mg  20 mg Oral Daily Dereck Leep, MD       And  . Derrill Memo ON 07/27/2016] hydrochlorothiazide (HYDRODIURIL) tablet 25 mg  25 mg Oral Daily Dereck Leep, MD      . insulin aspart (novoLOG) injection 0-15 Units  0-15 Units Subcutaneous TID WC Dereck Leep, MD      . loratadine (CLARITIN) tablet 10 mg  10 mg Oral Daily PRN Dereck Leep, MD      . magnesium hydroxide (MILK OF MAGNESIA) suspension 30 mL  30 mL Oral Daily PRN Dereck Leep, MD      . menthol-cetylpyridinium (CEPACOL) lozenge 3 mg  1 lozenge Oral PRN Dereck Leep, MD       Or  . phenol (CHLORASEPTIC) mouth spray 1 spray  1 spray Mouth/Throat PRN Dereck Leep, MD      . metFORMIN (GLUCOPHAGE-XR) 24 hr tablet 1,500 mg  1,500 mg Oral QHS Dereck Leep, MD      . metoCLOPramide (REGLAN) tablet 10 mg  10 mg Oral TID AC & HS Dereck Leep, MD      . mometasone-formoterol (DULERA) 200-5 MCG/ACT inhaler 2 puff  2 puff Inhalation BID Dereck Leep, MD      . morphine 2 MG/ML injection 2 mg  2 mg Intravenous Q2H PRN Dereck Leep, MD      . multivitamin with minerals tablet 0.5 tablet  0.5 tablet Oral BID Dereck Leep, MD      . ondansetron (ZOFRAN) tablet 4 mg  4 mg Oral Q6H PRN Dereck Leep, MD       Or  . ondansetron (ZOFRAN) injection 4 mg  4 mg Intravenous Q6H PRN Dereck Leep, MD      . oxyCODONE (Oxy IR/ROXICODONE) immediate release tablet 5-10 mg  5-10 mg Oral Q4H PRN Dereck Leep, MD   5 mg at 07/26/16 1315  . pantoprazole (PROTONIX) EC tablet 40 mg  40 mg Oral BID Dereck Leep, MD      . senna-docusate (Senokot-S) tablet 1 tablet  1 tablet Oral BID Dereck Leep, MD      . sodium phosphate (FLEET) 7-19 GM/118ML enema  1 enema  1 enema Rectal Once PRN Dereck Leep, MD      . testosterone (ANDROGEL) 50 MG/5GM (1%) gel 2.5 g  2.5 g Transdermal Daily Dereck Leep, MD      . traMADol Veatrice Bourbon) tablet 50-100 mg  50-100 mg Oral Q4H PRN Dereck Leep, MD         Discharge Medications: Please see discharge summary for a list of discharge medications.  Relevant Imaging Results:  Relevant Lab Results:   Additional Information  (SSN: [id number])  Sample, Veronia Beets, LCSW

## 2016-07-26 NOTE — Anesthesia Post-op Follow-up Note (Cosign Needed)
Anesthesia QCDR form completed.        

## 2016-07-26 NOTE — Op Note (Signed)
OPERATIVE NOTE  DATE OF SURGERY:  07/26/2016  PATIENT NAME:  EROL FLANAGIN   DOB: 1950-09-30  MRN: [id number]  PRE-OPERATIVE DIAGNOSIS: Degenerative arthrosis of the left knee, primary  POST-OPERATIVE DIAGNOSIS:  Same  PROCEDURE:  Left total knee arthroplasty using computer-assisted navigation  SURGEON:  Marciano Sequin. M.D.  ASSISTANT:  Vance Peper, PA (present and scrubbed throughout the case, critical for assistance with exposure, retraction, instrumentation, and closure)  ANESTHESIA: spinal  ESTIMATED BLOOD LOSS: 50 mL  FLUIDS REPLACED: 1000 mL of crystalloid  TOURNIQUET TIME: 85 minutes  DRAINS: 2 medium drains to a reinfusion system  SOFT TISSUE RELEASES: Anterior cruciate ligament, posterior cruciate ligament, deep medial collateral ligament, patellofemoral ligament  IMPLANTS UTILIZED: DePuy Attune size 5 posterior stabilized femoral component (cemented), size 6 rotating platform tibial component (cemented), 41 mm medialized dome patella (cemented), and a 6 mm stabilized rotating platform polyethylene insert.  INDICATIONS FOR SURGERY: JAQUAVIAN FIRKUS is a 66 y.o. year old male with a long history of progressive knee pain. X-rays demonstrated severe degenerative changes in tricompartmental fashion. The patient had not seen any significant improvement despite conservative nonsurgical intervention. After discussion of the risks and benefits of surgical intervention, the patient expressed understanding of the risks benefits and agree with plans for total knee arthroplasty.   The risks, benefits, and alternatives were discussed at length including but not limited to the risks of infection, bleeding, nerve injury, stiffness, blood clots, the need for revision surgery, cardiopulmonary complications, among others, and they were willing to proceed.  PROCEDURE IN DETAIL: The patient was brought into the operating room and, after adequate spinal anesthesia was achieved, a tourniquet was  placed on the patient's upper thigh. The patient's knee and leg were cleaned and prepped with alcohol and DuraPrep and draped in the usual sterile fashion. A "timeout" was performed as per usual protocol. The lower extremity was exsanguinated using an Esmarch, and the tourniquet was inflated to 300 mmHg. An anterior longitudinal incision was made followed by a standard mid vastus approach. The deep fibers of the medial collateral ligament were elevated in a subperiosteal fashion off of the medial flare of the tibia so as to maintain a continuous soft tissue sleeve. The patella was subluxed laterally and the patellofemoral ligament was incised. Inspection of the knee demonstrated severe degenerative changes with full-thickness loss of articular cartilage. Osteophytes were debrided using a rongeur. Anterior and posterior cruciate ligaments were excised. Two 4.0 mm Schanz pins were inserted in the femur and into the tibia for attachment of the array of trackers used for computer-assisted navigation. Hip center was identified using a circumduction technique. Distal landmarks were mapped using the computer. The distal femur and proximal tibia were mapped using the computer. The distal femoral cutting guide was positioned using computer-assisted navigation so as to achieve a 5 distal valgus cut. The femur was sized and it was felt that a size 5 femoral component was appropriate. A size 5 femoral cutting guide was positioned and the anterior cut was performed and verified using the computer. This was followed by completion of the posterior and chamfer cuts. Femoral cutting guide for the central box was then positioned in the center box cut was performed.  Attention was then directed to the proximal tibia. Medial and lateral menisci were excised. The extramedullary tibial cutting guide was positioned using computer-assisted navigation so as to achieve a 0 varus-valgus alignment and 3 posterior slope. The cut was  performed and verified using the computer.  The proximal tibia was sized and it was felt that a size 6 tibial tray was appropriate. Tibial and femoral trials were inserted followed by insertion of a 6 mm polyethylene insert. This allowed for excellent mediolateral soft tissue balancing both in flexion and in full extension. Finally, the patella was cut and prepared so as to accommodate a 41 mm medialized dome patella. A patella trial was placed and the knee was placed through a range of motion with excellent patellar tracking appreciated. The femoral trial was removed after debridement of posterior osteophytes. The central post-hole for the tibial component was reamed followed by insertion of a keel punch. Tibial trials were then removed. Cut surfaces of bone were irrigated with copious amounts of normal saline with antibiotic solution using pulsatile lavage and then suctioned dry. Polymethylmethacrylate cement with gentamicin was prepared in the usual fashion using a vacuum mixer. Cement was applied to the cut surface of the proximal tibia as well as along the undersurface of a size 6 rotating platform tibial component. Tibial component was positioned and impacted into place. Excess cement was removed using [employer] fast streamer. Cement was then applied to the cut surfaces of the femur as well as along the posterior flanges of the size 5 femoral component. The femoral component was positioned and impacted into place. Excess cement was removed using [employer] fast streamer. A 6 mm polyethylene trial was inserted and the knee was brought into full extension with steady axial compression applied. Finally, cement was applied to the backside of a 41 mm medialized dome patella and the patellar component was positioned and patellar clamp applied. Excess cement was removed using [employer] fast streamer. After adequate curing of the cement, the tourniquet was deflated after a total tourniquet time of 85 minutes. Hemostasis was achieved using  electrocautery. The knee was irrigated with copious amounts of normal saline with antibiotic solution using pulsatile lavage and then suctioned dry. 20 mL of 1.3% Exparel and 60 mL of 0.25% Marcaine in 40 mL of normal saline was injected along the posterior capsule, medial and lateral gutters, and along the arthrotomy site. A 6 mm stabilized rotating platform polyethylene insert was inserted and the knee was placed through a range of motion with excellent mediolateral soft tissue balancing appreciated and excellent patellar tracking noted. 2 medium drains were placed in the wound bed and brought out through separate stab incisions to be attached to a reinfusion system. The medial parapatellar portion of the incision was reapproximated using interrupted sutures of #1 Vicryl. Subcutaneous tissue was approximated in layers using first #0 Vicryl followed #2-0 Vicryl. The skin was approximated with skin staples. A sterile dressing was applied.  The patient tolerated the procedure well and was transported to the recovery room in stable condition.    Sharisse Rantz P. Holley Bouche., M.D.

## 2016-07-27 LAB — GLUCOSE, CAPILLARY
GLUCOSE-CAPILLARY: 113 mg/dL — AB (ref 65–99)
GLUCOSE-CAPILLARY: 132 mg/dL — AB (ref 65–99)
GLUCOSE-CAPILLARY: 122 mg/dL — AB (ref 65–99)
Glucose-Capillary: 135 mg/dL — ABNORMAL HIGH (ref 65–99)

## 2016-07-27 LAB — BASIC METABOLIC PANEL
ANION GAP: 6 (ref 5–15)
BUN: 13 mg/dL (ref 6–20)
CHLORIDE: 104 mmol/L (ref 101–111)
CO2: 26 mmol/L (ref 22–32)
Calcium: 8.7 mg/dL — ABNORMAL LOW (ref 8.9–10.3)
Creatinine, Ser: 0.85 mg/dL (ref 0.61–1.24)
GFR calc Af Amer: >60 mL/min (ref >60)
GLUCOSE: 126 mg/dL — AB (ref 65–99)
POTASSIUM: 3.7 mmol/L (ref 3.5–5.1)
SODIUM: 136 mmol/L (ref 135–145)

## 2016-07-27 LAB — CBC
HCT: 38 % — ABNORMAL LOW (ref 40.0–52.0)
HEMOGLOBIN: 13 g/dL (ref 13.0–18.0)
MCH: 35.1 pg — AB (ref 26.0–34.0)
MCHC: 34.1 g/dL (ref 32.0–36.0)
MCV: 102.9 fL — AB (ref 80.0–100.0)
Platelets: 297 10*3/uL (ref 150–440)
RBC: 3.69 MIL/uL — AB (ref 4.40–5.90)
RDW: 13.5 % (ref 11.5–14.5)
WBC: 9.3 10*3/uL (ref 3.8–10.6)

## 2016-07-27 NOTE — Progress Notes (Signed)
Physical Therapy Treatment Patient Details Name: Benjamin Nicholson MRN: [id number] DOB: [birth year] Today's Date: 07/27/2016    History of Present Illness Pt is a 66 y/o M s/p L TKA.  Pt's PMH includes prepatellar bursitis.    PT Comments    Mr. Aldridge is progress well with mobility, ambulating 230 ft with RW and supervision for safety.  Pt required a seated rest break before and after stair training.  He completed stair training this session with no questions or concerns.  His L knee ROM was -2 deg L knee extension AROM and 94 deg L knee flexion with AAROM. Pt will benefit from continued skilled PT services to increase functional independence and safety.   Follow Up Recommendations  Home health PT     Equipment Recommendations  None recommended by PT    Recommendations for Other Services       Precautions / Restrictions Precautions Precautions: Fall Restrictions Weight Bearing Restrictions: Yes LLE Weight Bearing: Weight bearing as tolerated    Mobility  Bed Mobility Overal bed mobility: Modified Independent Bed Mobility: Supine to Sit     Supine to sit: HOB elevated;Modified independent (Device/Increase time)     General bed mobility comments: HOB slightly elevated but no use of bed rails.  Pt requires increased time and effort but no physical assist or cues needed.  Transfers Overall transfer level: Needs assistance Equipment used: Rolling walker (2 wheeled) Transfers: Sit to/from Stand Sit to Stand: Supervision         General transfer comment: Pt demonstrated proper technique for sit<>stand without requiring cues or assist.  Supervision provided for safety.  Ambulation/Gait Ambulation/Gait assistance: Supervision Ambulation Distance (Feet): 230 Feet Assistive device: Rolling walker (2 wheeled) Gait Pattern/deviations: Step-to pattern;Step-through pattern;Decreased stride length;Decreased stance time - left;Decreased step length - right;Decreased weight shift to  left;Antalgic Gait velocity: decreased Gait velocity interpretation: Below normal speed for age/gender General Gait Details: Cues to relax shoulders and for forward gaze.  Demonstration and cues for heel strike.  Seated rest break before and after stair training.     Stairs Stairs: Yes   Stair Management: Two rails;Backwards;Forwards;With walker;Step to pattern Number of Stairs: 5 (1,4) General stair comments: Pt performed one step backwards with assist to hold RW in place to simulate one step inside home. He then ascends/descends 4 steps forward with Bil rails and step to pattern with cues for sequencing.  Min guard assist provided for safety.  Wheelchair Mobility    Modified Rankin (Stroke Patients Only)       Balance Overall balance assessment: Needs assistance Sitting-balance support: No upper extremity supported;Feet supported Sitting balance-Leahy Scale: Good     Standing balance support: Bilateral upper extremity supported;During functional activity Standing balance-Leahy Scale: Fair Standing balance comment: Pt able to stand statically without UE support but requires RW for dynamic activities                            Cognition Arousal/Alertness: Awake/alert Behavior During Therapy: WFL for tasks assessed/performed Overall Cognitive Status: Within Functional Limits for tasks assessed                                        Exercises Total Joint Exercises Quad Sets: Strengthening;Both;10 reps;Supine;Other (comment) (with 3 second holds and very light manual overpressure) Gluteal Sets: Strengthening;Both;10 reps;Supine Straight Leg Raises: AROM;Left;Supine;10 reps;Other (comment) (  with 5/10 focused on eccentric component) Knee Flexion: AAROM;Left;5 reps;Seated;Other (comment) (with 5 second holds) Goniometric ROM: -2 deg L knee extension AROM.  94 deg L knee flexion with AAROM.    General Comments        Pertinent Vitals/Pain Pain  Assessment: 0-10 Pain Score: 3  Pain Location: L knee Pain Descriptors / Indicators: Grimacing;Guarding;Aching Pain Intervention(s): Limited activity within patient's tolerance;Monitored during session;Premedicated before session    Home Living                      Prior Function            PT Goals (current goals can now be found in the care plan section) Acute Rehab PT Goals Patient Stated Goal: to be able to do more things around the house  PT Goal Formulation: With patient Time For Goal Achievement: 08/09/16 Potential to Achieve Goals: Good Progress towards PT goals: Progressing toward goals    Frequency    BID      PT Plan Current plan remains appropriate    Co-evaluation              AM-PAC PT "6 Clicks" Daily Activity  Outcome Measure  Difficulty turning over in bed (including adjusting bedclothes, sheets and blankets)?: A Little Difficulty moving from lying on back to sitting on the side of the bed? : A Little Difficulty sitting down on and standing up from a chair with arms (e.g., wheelchair, bedside commode, etc,.)?: A Little Help needed moving to and from a bed to chair (including a wheelchair)?: A Little Help needed walking in hospital room?: A Little Help needed climbing 3-5 steps with a railing? : A Little 6 Click Score: 18    End of Session Equipment Utilized During Treatment: Gait belt Activity Tolerance: Patient tolerated treatment well Patient left: Other (comment);with call bell/phone within reach (on [facility] with OT in room) Nurse Communication: Mobility status PT Visit Diagnosis: Pain;Unsteadiness on feet (R26.81);Other abnormalities of gait and mobility (R26.89) Pain - Right/Left: Left Pain - part of body: Knee     Time: 0950-1030 PT Time Calculation (min) (ACUTE ONLY): 40 min  Charges:  $Gait Training: 23-37 mins $Therapeutic Exercise: 8-22 mins                    G Codes:       Collie Siad PT, DPT 07/27/2016, 11:07  AM

## 2016-07-27 NOTE — Progress Notes (Signed)
   Subjective: 1 Day Post-Op Procedure(s) (LRB): COMPUTER ASSISTED TOTAL KNEE ARTHROPLASTY (Left) Patient reports pain as mild.   Patient is well, and has had no acute complaints or problems Patient did 215 feet yesterday on day 1 following surgery and had 91 of flexion. Plan is to go Home after hospital stay. no nausea and no vomiting Patient denies any chest pains or shortness of breath. Objective: Vital signs in last 24 hours: Temp:  [97.6 F (36.4 C)-98.6 F (37 C)] 98.2 F (36.8 C) (05/01 0059) Pulse Rate:  [60-77] 71 (05/01 0059) Resp:  [11-22] 20 (05/01 0059) BP: (100-156)/(71-97) 156/85 (05/01 0059) SpO2:  [94 %-100 %] 94 % (05/01 0059) FiO2 (%):  [28 %] 28 % (04/30 1229) Heels are non tender and elevated off the bed using rolled towels as well as bone foam under operative leg.  Intake/Output from previous day: 04/30 0701 - 05/01 0700 In: 2143.3 [P.O.:240; I.V.:1803.3; IV Piggyback:100] Out: 4730 [Urine:4450; Drains:230; Blood:50] Intake/Output this shift: No intake/output data recorded.   Recent Labs  07/27/16 0624  HGB 13.0    Recent Labs  07/27/16 0624  WBC 9.3  RBC 3.69*  HCT 38.0*  PLT 297    Recent Labs  07/27/16 0624  NA 136  K 3.7  CL 104  CO2 26  BUN 13  CREATININE 0.85  GLUCOSE 126*  CALCIUM 8.7*   No results for input(s): LABPT, INR in the last 72 hours.  EXAM General - Patient is Alert, Appropriate and Oriented Extremity - Neurologically intact Neurovascular intact Sensation intact distally Intact pulses distally Dorsiflexion/Plantar flexion intact Compartment soft Dressing - dressing C/D/I Motor Function - intact, moving foot and toes well on exam.    Past Medical History:  Diagnosis Date  . Asthma   . Diabetes mellitus without complication ([place])   . Diverticulosis   . Dyspnea   . Environmental and seasonal allergies   . GERD (gastroesophageal reflux disease)   . Hypertension   . Hypogonadism in male   .  Osteoarthritis   . Prepatellar bursitis   . Sleep apnea     Assessment/Plan: 1 Day Post-Op Procedure(s) (LRB): COMPUTER ASSISTED TOTAL KNEE ARTHROPLASTY (Left) Active Problems:   S/P total knee arthroplasty  Estimated body mass index is 29.71 kg/m as calculated from the following:   Height as of this encounter: 5\' 11"  (1.803 m).   Weight as of this encounter: 96.6 kg (213 lb). Advance diet Up with therapy D/C IV fluids Plan for discharge tomorrow Discharge home with home health  Labs: Labs were reviewed and acceptable. DVT Prophylaxis - Lovenox, Foot Pumps and TED hose Weight-Bearing as tolerated to left leg D/C O2 and Pulse OX and try on Room Air Begin working on bowel movement. Labs tomorrow morning.  Jillyn Ledger. [place] 07/27/2016, 7:31 AM

## 2016-07-27 NOTE — Discharge Instructions (Signed)
°Instructions after Total Knee Replacement ° ° James P. Hooten, Jr., M.D.    ° Dept. of Orthopaedics & Sports Medicine ° [facility] ° [address] ° [place], [place]  [address] ° Phone: [phone]   Fax: [phone] ° °  °DIET: °• Drink plenty of non-alcoholic fluids. °• Resume your normal diet. Include foods high in fiber. ° °ACTIVITY:  °• You may use crutches or a walker with weight-bearing as tolerated, unless instructed otherwise. °• You may be weaned off of the walker or crutches by your Physical Therapist.  °• Do NOT place pillows under the knee. Anything placed under the knee could limit your ability to straighten the knee.   °• Continue doing gentle exercises. Exercising will reduce the pain and swelling, increase motion, and prevent muscle weakness.   °• Please continue to use the TED compression stockings for 6 weeks. You may remove the stockings at night, but should reapply them in the morning. °• Do not drive or operate any equipment until instructed. ° °WOUND CARE:  °• Continue to use the PolarCare or ice packs periodically to reduce pain and swelling. °• You may bathe or shower after the staples are removed at the first office visit following surgery. ° °MEDICATIONS: °• You may resume your regular medications. °• Please take the pain medication as prescribed on the medication. °• Do not take pain medication on an empty stomach. °• You have been given a prescription for a blood thinner (Lovenox or Coumadin). Please take the medication as instructed. (NOTE: After completing a 2 week course of Lovenox, take one Enteric-coated aspirin once a day. This along with elevation will help reduce the possibility of phlebitis in your operated leg.) °• Do not drive or drink alcoholic beverages when taking pain medications. ° °CALL THE OFFICE FOR: °• Temperature above 101 degrees °• Excessive bleeding or drainage on the dressing. °• Excessive swelling, coldness, or paleness of the toes. °• Persistent  nausea and vomiting. ° °FOLLOW-UP:  °• You should have an appointment to return to the office in 10-14 days after surgery. °• Arrangements have been made for continuation of Physical Therapy (either home therapy or outpatient therapy). °  ° ° °TOTAL KNEE REPLACEMENT POSTOPERATIVE DIRECTIONS ° °Knee Rehabilitation, Guidelines Following Surgery  °Results after knee surgery are often greatly improved when you follow the exercise, range of motion and muscle strengthening exercises prescribed by your doctor. Safety measures are also important to protect the knee from further injury. Any time any of these exercises cause you to have increased pain or swelling in your knee joint, decrease the amount until you are comfortable again and slowly increase them. If you have problems or questions, call your caregiver or physical therapist for advice.  ° °HOME CARE INSTRUCTIONS  °Remove items at home which could result in a fall. This includes throw rugs or furniture in walking pathways.  °· Continue to use polar care unit on the knee for pain and swelling from surgery. You may notice swelling that will progress down to the foot and ankle.  This is normal after surgery.  Elevate the leg when you are not up walking on it.   °· Continue to use the breathing machine which will help keep your temperature down.  It is common for your temperature to cycle up and down following surgery, especially at night when you are not up moving around and exerting yourself.  The breathing machine keeps your lungs expanded and your temperature down. °· Do not   place pillow under knee, focus on keeping the knee STRAIGHT while resting ° °DIET °You may resume your previous home diet once your are discharged from the hospital. ° °DRESSING / WOUND CARE / SHOWERING °You may start showering once staples have been removed. Change the surgical dressing as needed. ° °STAPLE REMOVAL: °Staples are to be removed at two weeks post op and apply benzoin and 1/2 inch  steri strips ° °ACTIVITY °Walk with your walker as instructed. °Use walker as long as suggested by your caregivers. °Avoid periods of inactivity such as sitting longer than an hour when not asleep. This helps prevent blood clots.  °You may resume a sexual relationship in one month or when given the OK by your doctor.  °You may return to work once you are cleared by your doctor.  °Do not drive a car for 6 weeks or until released by you surgeon.  °Do not drive while taking narcotics. ° °WEIGHT BEARING °Weight bearing as tolerated with assist device (walker, cane, etc) as directed, use it as long as suggested by your surgeon or therapist, typically at least 4-6 weeks. ° °POSTOPERATIVE CONSTIPATION PROTOCOL °Constipation - defined medically as fewer than three stools per week and severe constipation as less than one stool per week. ° °One of the most common issues patients have following surgery is constipation.  Even if you have a regular bowel pattern at home, your normal regimen is likely to be disrupted due to multiple reasons following surgery.  Combination of anesthesia, postoperative narcotics, change in appetite and fluid intake all can affect your bowels.  In order to avoid complications following surgery, here are some recommendations in order to help you during your recovery period. ° °Colace (docusate) - Pick up an over-the-counter form of Colace or another stool softener and take twice a day as long as you are requiring postoperative pain medications.  Take with a full glass of water daily.  If you experience loose stools or diarrhea, hold the colace until you stool forms back up.  If your symptoms do not get better within 1 week or if they get worse, check with your doctor. ° °Dulcolax (bisacodyl) - Pick up over-the-counter and take as directed by the product packaging as needed to assist with the movement of your bowels.  Take with a full glass of water.  Use this product as needed if not relieved by  Colace only.  ° °MiraLax (polyethylene glycol) - Pick up over-the-counter to have on hand.  MiraLax is a solution that will increase the amount of water in your bowels to assist with bowel movements.  Take as directed and can mix with a glass of water, juice, soda, coffee, or tea.  Take if you go more than two days without a movement. °Do not use MiraLax more than once per day. Call your doctor if you are still constipated or irregular after using this medication for 7 days in a row. ° °If you continue to have problems with postoperative constipation, please contact the office for further assistance and recommendations.  If you experience "the worst abdominal pain ever" or develop nausea or vomiting, please contact the office immediatly for further recommendations for treatment. ° °ITCHING ° If you experience itching with your medications, try taking only a single pain pill, or even half a pain pill at a time.  You can also use Benadryl over the counter for itching or also to help with sleep.  ° °TED HOSE STOCKINGS °Wear the elastic stockings   on both legs for  6 weeks following surgery during the day but you may remove then at night for sleeping. ° °MEDICATIONS °See your medication summary on the “After Visit Summary” that the nursing staff will review with you prior to discharge.  You may have some home medications which will be placed on hold until you complete the course of blood thinner medication.  It is important for you to complete the blood thinner medication as prescribed by your surgeon.  Continue your approved medications as instructed at time of discharge. ° °PRECAUTIONS °If you experience chest pain or shortness of breath - call 911 immediately for transfer to the hospital emergency department.  °If you develop a fever greater that 101 F, purulent drainage from wound, increased redness or drainage from wound, foul odor from the wound/dressing, or calf pain - CONTACT YOUR SURGEON.   °                                                 °FOLLOW-UP APPOINTMENTS °Make sure you keep all of your appointments after your operation with your surgeon and caregivers. You should call the office at the above phone number and make an appointment for approximately two weeks after the date of your surgery or on the date instructed by your surgeon outlined in the "After Visit Summary". ° ° °RANGE OF MOTION AND STRENGTHENING EXERCISES  °Rehabilitation of the knee is important following a knee injury or an operation. After just a few days of immobilization, the muscles of the thigh which control the knee become weakened and shrink (atrophy). Knee exercises are designed to build up the tone and strength of the thigh muscles and to improve knee motion. Often times heat used for twenty to thirty minutes before working out will loosen up your tissues and help with improving the range of motion but do not use heat for the first two weeks following surgery. These exercises can be done on a training (exercise) mat, on the floor, on a table or on a bed. Use what ever works the best and is most comfortable for you Knee exercises include:  °Leg Lifts - While your knee is still immobilized in a splint or cast, you can do straight leg raises. Lift the leg to 60 degrees, hold for 3 sec, and slowly lower the leg. Repeat 10-20 times 2-3 times daily. Perform this exercise against resistance later as your knee gets better.  °Quad and Hamstring Sets - Tighten up the muscle on the front of the thigh (Quad) and hold for 5-10 sec. Repeat this 10-20 times hourly. Hamstring sets are done by pushing the foot backward against an object and holding for 5-10 sec. Repeat as with quad sets.  °· Leg Slides: Lying on your back, slowly slide your foot toward your buttocks, bending your knee up off the floor (only go as far as is comfortable). Then slowly slide your foot back down until your leg is flat on the floor again. °· Angel Wings: Lying on your back spread your  legs to the side as far apart as you can without causing discomfort.  °A rehabilitation program following serious knee injuries can speed recovery and prevent re-injury in the future due to weakened muscles. Contact your doctor or a physical therapist for more information on knee rehabilitation.  ° °IF YOU ARE TRANSFERRED TO   A SKILLED REHAB FACILITY °If the patient is transferred to a skilled rehab facility following release from the hospital, a list of the current medications will be sent to the facility for the patient to continue.  When discharged from the skilled rehab facility, please have the facility set up the patient's Home Health Physical Therapy prior to being released. Also, the skilled facility will be responsible for providing the patient with their medications at time of release from the facility to include their pain medication, the muscle relaxants, and their blood thinner medication. If the patient is still at the rehab facility at time of the two week follow up appointment, the skilled rehab facility will also need to assist the patient in arranging follow up appointment in our office and any transportation needs. ° °MAKE SURE YOU:  °Understand these instructions.  °Get help right away if you are not doing well or get worse.  ° ° ° ° °

## 2016-07-27 NOTE — Discharge Summary (Signed)
Physician Discharge Summary  Patient ID: Benjamin Nicholson MRN: [id number] DOB/AGE: 07-31-1950 66 y.o.  Admit date: 07/26/2016 Discharge date: 07/28/2016  Admission Diagnoses:  PRIMARY OSTEOARTHRITIS OF LEFT KNEE   Discharge Diagnoses: Patient Active Problem List   Diagnosis Date Noted  . S/P total knee arthroplasty 07/26/2016  . Primary osteoarthritis of left knee 01/09/2016  . Left knee pain 10/13/2015  . Essential hypertension 11/08/2014  . Gastroesophageal reflux disease without esophagitis 11/08/2014  . Hypogonadism in male 11/08/2014  . Mild persistent asthma 11/08/2014  . Sleep disorder 11/08/2014  . Type 2 diabetes mellitus, controlled ([place]) 11/08/2014    Past Medical History:  Diagnosis Date  . Asthma   . Diabetes mellitus without complication ([place])   . Diverticulosis   . Dyspnea   . Environmental and seasonal allergies   . GERD (gastroesophageal reflux disease)   . Hypertension   . Hypogonadism in male   . Osteoarthritis   . Prepatellar bursitis   . Sleep apnea      Transfusion: No transfusions during this admission   Consultants (if any):   Discharged Condition: Improved  Hospital Course: Benjamin Nicholson is an 66 y.o. male who was admitted 07/26/2016 with a diagnosis of degenerative arthrosis left knee and went to the operating room on 07/26/2016 and underwent the above named procedures.    Surgeries:Procedure(s): COMPUTER ASSISTED TOTAL KNEE ARTHROPLASTY on 07/26/2016  PRE-OPERATIVE DIAGNOSIS: Degenerative arthrosis of the left knee, primary  POST-OPERATIVE DIAGNOSIS:  Same  PROCEDURE:  Left total knee arthroplasty using computer-assisted navigation  SURGEON:  Marciano Sequin. M.D.  ASSISTANT:  Vance Peper, PA (present and scrubbed throughout the case, critical for assistance with exposure, retraction, instrumentation, and closure)  ANESTHESIA: spinal  ESTIMATED BLOOD LOSS: 50 mL  FLUIDS REPLACED: 1000 mL of crystalloid  TOURNIQUET TIME: 85  minutes  DRAINS: 2 medium drains to a reinfusion system  SOFT TISSUE RELEASES: Anterior cruciate ligament, posterior cruciate ligament, deep medial collateral ligament, patellofemoral ligament  IMPLANTS UTILIZED: DePuy Attune size 5 posterior stabilized femoral component (cemented), size 6 rotating platform tibial component (cemented), 41 mm medialized dome patella (cemented), and a 6 mm stabilized rotating platform polyethylene insert.  INDICATIONS FOR SURGERY: Benjamin Nicholson is a 66 y.o. year old male with a long history of progressive knee pain. X-rays demonstrated severe degenerative changes in tricompartmental fashion. The patient had not seen any significant improvement despite conservative nonsurgical intervention. After discussion of the risks and benefits of surgical intervention, the patient expressed understanding of the risks benefits and agree with plans for total knee arthroplasty.   The risks, benefits, and alternatives were discussed at length including but not limited to the risks of infection, bleeding, nerve injury, stiffness, blood clots, the need for revision surgery, cardiopulmonary complications, among others, and they were willing to proceed.  Patient tolerated the surgery well. No complications .Patient was taken to PACU where she was stabilized and then transferred to the orthopedic floor.  Patient started on Lovenox 30 mg q 12 hrs. Foot pumps applied bilaterally at 80 mm hgb. Heels elevated off bed with rolled towels. No evidence of DVT. Calves non tender. Negative Homan. Physical therapy started on day #1 for gait training and transfer with OT starting on  day #1 for ADL and assisted devices. Patient has done well with therapy. Ambulated greater than 215 feet feet upon being discharged.  Patient's IV And Foley were discontinued on day #1 with Hemovac being discontinued on day #2. Dressing was changed on  day 2 prior to patient being discharged   He was given  perioperative antibiotics:  Anti-infectives    Start     Dose/Rate Route Frequency Ordered Stop   07/26/16 1400  ceFAZolin (ANCEF) 2 g in dextrose 5 % 100 mL IVPB     2 g 240 mL/hr over 30 Minutes Intravenous Every 6 hours 07/26/16 1257 07/27/16 1359   07/26/16 1230  ceFAZolin (ANCEF) IVPB 2g/100 mL premix  Status:  Discontinued     2 g 200 mL/hr over 30 Minutes Intravenous Every 6 hours 07/26/16 1228 07/26/16 1257   07/26/16 0600  ceFAZolin (ANCEF) IVPB 2g/100 mL premix     2 g 200 mL/hr over 30 Minutes Intravenous On call to O.R. 07/25/16 2153 07/26/16 0800   07/26/16 0552  ceFAZolin (ANCEF) 2-4 GM/100ML-% IVPB    Comments:  LEWIS, CINDY: cabinet override      07/26/16 0552 07/26/16 0745    .  He was fitted with AV 1 compression foot pump devices, instructed on heel pumps, early ambulation, and fitted with TED stockings bilaterally for DVT prophylaxis.  He benefited maximally from the hospital stay and there were no complications.    Recent vital signs:  Vitals:   07/26/16 2047 07/27/16 0059  BP: (!) 143/85 (!) 156/85  Pulse: 75 71  Resp:  20  Temp: 98.3 F (36.8 C) 98.2 F (36.8 C)    Recent laboratory studies:  Lab Results  Component Value Date   HGB 13.0 07/27/2016   HGB 14.0 07/14/2016   HGB 13.3 06/29/2013   Lab Results  Component Value Date   WBC 9.3 07/27/2016   PLT 297 07/27/2016   Lab Results  Component Value Date   INR 0.89 07/14/2016   Lab Results  Component Value Date   NA 136 07/27/2016   K 3.7 07/27/2016   CL 104 07/27/2016   CO2 26 07/27/2016   BUN 13 07/27/2016   CREATININE 0.85 07/27/2016   GLUCOSE 126 (H) 07/27/2016    Discharge Medications:   Allergies as of 07/28/2016      Reactions   Sulfa Antibiotics Rash   'flu-like' symptoms      Medication List    STOP taking these medications   aspirin EC 81 MG tablet     TAKE these medications   albuterol 108 (90 Base) MCG/ACT inhaler Commonly known as:  PROVENTIL HFA;VENTOLIN  HFA Inhale 2 puffs into the lungs every 6 (six) hours as needed for wheezing or shortness of breath.   amLODipine 5 MG tablet Commonly known as:  NORVASC Take 5 mg by mouth daily.   atorvastatin 20 MG tablet Commonly known as:  LIPITOR Take 20 mg by mouth daily.   budesonide-formoterol 160-4.5 MCG/ACT inhaler Commonly known as:  SYMBICORT Inhale 2 puffs into the lungs 2 (two) times daily.   calcium carbonate 500 MG chewable tablet Commonly known as:  TUMS - dosed in mg elemental calcium Chew 2 tablets by mouth daily as needed for indigestion or heartburn.   enoxaparin 30 MG/0.3ML injection Commonly known as:  LOVENOX Inject 0.4 mLs (40 mg total) into the skin daily.   fluticasone 50 MCG/ACT nasal spray Commonly known as:  FLONASE Place 1 spray into both nostrils daily as needed for allergies or rhinitis.   Glucosamine Chondroit-Collagen Caps Take 2 capsules by mouth 2 (two) times daily.   lisinopril-hydrochlorothiazide 20-25 MG tablet Commonly known as:  PRINZIDE,ZESTORETIC Take 1 tablet by mouth daily.   loratadine 10 MG tablet Commonly  known as:  CLARITIN Take 10 mg by mouth daily as needed for allergies.   metFORMIN 500 MG 24 hr tablet Commonly known as:  GLUCOPHAGE-XR Take 1,500 mg by mouth at bedtime.   multivitamin with minerals Tabs tablet Take 0.5 tablets by mouth 2 (two) times daily.   NONFORMULARY OR COMPOUNDED ITEM Take 4 tablets by mouth 2 (two) times daily. Alkalete   omeprazole 20 MG capsule Commonly known as:  PRILOSEC Take 20 mg by mouth 2 (two) times daily.   oxyCODONE 5 MG immediate release tablet Commonly known as:  Oxy IR/ROXICODONE Take 1-2 tablets (5-10 mg total) by mouth every 4 (four) hours as needed for severe pain or breakthrough pain.   PROBIOTIC ACIDOPHILUS PO Take 2 tablets by mouth every evening.   testosterone 50 MG/5GM (1%) Gel Commonly known as:  ANDROGEL Place 2.5 g onto the skin daily.   traMADol 50 MG tablet Commonly  known as:  ULTRAM Take 1-2 tablets (50-100 mg total) by mouth every 4 (four) hours as needed for moderate pain.   VIAGRA 50 MG tablet Generic drug:  sildenafil Take 50 mg by mouth as needed for erectile dysfunction.            Durable Medical Equipment        Start     Ordered   07/26/16 1229  DME Walker rolling  Once    Question:  Patient needs a walker to treat with the following condition  Answer:  Total knee replacement status   07/26/16 1228   07/26/16 1229  DME Bedside commode  Once    Question:  Patient needs a bedside commode to treat with the following condition  Answer:  Total knee replacement status   07/26/16 1228      Diagnostic Studies: Dg Knee Left Port  Result Date: 07/26/2016 CLINICAL DATA:  Left knee replacement . EXAM: PORTABLE LEFT KNEE - 1-2 VIEW COMPARISON:  No recent prior . FINDINGS: Total left knee replacement. Hardware intact. Anatomic alignment . No acute abnormality. IMPRESSION: Total left knee replacement . Electronically Signed   By: Marcello Moores  Register   On: 07/26/2016 11:31    Disposition: 01-Home or [place] On 08/10/2016.   Specialty:  Physician Assistant Why:  at 1:15pm Contact information: [place] [address] [phone]        Dereck Leep, MD On 09/07/2016.   Specialty:  Orthopedic Surgery Why:  at 8:30am Contact information: [place] [address] [phone]            Signed: Watt Climes. 07/27/2016, 7:45 AM

## 2016-07-27 NOTE — Progress Notes (Signed)
Pt up in chair this evening. Pain controlled 3/10. Bone foam, polar care in place. Dsg dry and intact. VSS. No acute distress noted. Staff will continue to monitor.

## 2016-07-27 NOTE — Progress Notes (Signed)
Physical Therapy Treatment Patient Details Name: Benjamin Nicholson MRN: [id number] DOB: 11/22/1950 Today's Date: 07/27/2016    History of Present Illness Pt is a 66 y/o M s/p L TKA.  Pt's PMH includes prepatellar bursitis.    PT Comments    [name] presented with significant L knee pain and therefore, ROM and ambulatory distance limited this session.  Pt remained very motivated and was agreeable to therapy.  He ambulated in room and completed therapeutic exercises as documented below.  Pt will benefit from continued skilled PT services to increase functional independence and safety.   Follow Up Recommendations  Home health PT     Equipment Recommendations  None recommended by PT    Recommendations for Other Services       Precautions / Restrictions Precautions Precautions: Fall Restrictions Weight Bearing Restrictions: Yes LLE Weight Bearing: Weight bearing as tolerated    Mobility  Bed Mobility Overal bed mobility: Modified Independent Bed Mobility: Sit to Supine       Sit to supine: Modified independent (Device/Increase time)   General bed mobility comments: Pt demonstrates leg hook technique to lift LLE into bed.  No physical assist or cues needed.  Transfers Overall transfer level: Needs assistance Equipment used: Rolling walker (2 wheeled) Transfers: Sit to/from Stand Sit to Stand: Supervision         General transfer comment: Pt demonstrated proper technique for sit<>stand without requiring cues or assist.  Supervision provided for safety.  Ambulation/Gait Ambulation/Gait assistance: Supervision Ambulation Distance (Feet): 70 Feet Assistive device: Rolling walker (2 wheeled) Gait Pattern/deviations: Step-to pattern;Step-through pattern;Decreased stride length;Decreased stance time - left;Decreased step length - right;Decreased weight shift to left;Antalgic Gait velocity: decreased Gait velocity interpretation: Below normal speed for age/gender General Gait  Details: Pt progresses from step to pattern to step through pattern without cues.  Cues to relax shoulders.  Ambulatory distance limited due to pain this session.   Stairs            Wheelchair Mobility    Modified Rankin (Stroke Patients Only)       Balance Overall balance assessment: Needs assistance Sitting-balance support: No upper extremity supported;Feet supported Sitting balance-Leahy Scale: Good     Standing balance support: Bilateral upper extremity supported;During functional activity Standing balance-Leahy Scale: Fair Standing balance comment: Pt able to stand statically without UE support but requires RW for dynamic activities                            Cognition Arousal/Alertness: Awake/alert Behavior During Therapy: WFL for tasks assessed/performed Overall Cognitive Status: Within Functional Limits for tasks assessed                                        Exercises Total Joint Exercises Quad Sets: Strengthening;Both;10 reps;Other (comment);Seated (with 3 second holds and very light manual overpressure) Gluteal Sets: Strengthening;Both;10 reps;Seated Straight Leg Raises: AROM;Left;10 reps;Other (comment);Seated (with 6/10 focused on eccentric component) Knee Flexion: AAROM;Left;Seated;Other (comment);Other reps (comment) (3 reps with 5 second holds) Goniometric ROM: -1 deg L knee extension AROM. 83 deg L knee flexion with AAROM.    General Comments        Pertinent Vitals/Pain Pain Assessment: 0-10 Pain Score: 9  Pain Location: L knee Pain Descriptors / Indicators: Grimacing;Guarding;Aching;Moaning Pain Intervention(s): Limited activity within patient's tolerance;Monitored during session;Repositioned;Other (comment);Premedicated before session (applied polar care  at end of session)    Home Living Family/patient expects to be discharged to:: Private residence Living Arrangements: Spouse/significant other Available Help at  Discharge: Family;Available PRN/intermittently Type of Home: House Home Access: Level entry   Home Layout: Two level;Able to live on main level with bedroom/bathroom Home Equipment: Gilford Rile - 2 wheels;Shower seat;Hand held shower head;Cane - single point;Bedside commode;Grab bars - tub/shower Additional Comments: Pt has a reacher and sock aid at home to use for LB dressing.    Prior Function Level of Independence: Independent with assistive device(s)      Comments: Pt uses cane when ambulating on uneven surfaces.  Otherwise does not use AD.  He is ind with bathing, dressing.   PT Goals (current goals can now be found in the care plan section) Acute Rehab PT Goals Patient Stated Goal: to be able to do more things around the house  PT Goal Formulation: With patient Time For Goal Achievement: 08/09/16 Potential to Achieve Goals: Good Progress towards PT goals: Not progressing toward goals - comment (due to pain)    Frequency    BID      PT Plan Current plan remains appropriate    Co-evaluation              AM-PAC PT "6 Clicks" Daily Activity  Outcome Measure  Difficulty turning over in bed (including adjusting bedclothes, sheets and blankets)?: A Little Difficulty moving from lying on back to sitting on the side of the bed? : A Little Difficulty sitting down on and standing up from a chair with arms (e.g., wheelchair, bedside commode, etc,.)?: A Little Help needed moving to and from a bed to chair (including a wheelchair)?: A Little Help needed walking in hospital room?: A Little Help needed climbing 3-5 steps with a railing? : A Little 6 Click Score: 18    End of Session Equipment Utilized During Treatment: Gait belt Activity Tolerance: Patient limited by pain Patient left: with call bell/phone within reach;in bed;with bed alarm set;with SCD's reapplied;Other (comment);with nursing/sitter in room (in bone foam and polar care in place) Nurse Communication: Mobility  status PT Visit Diagnosis: Pain;Unsteadiness on feet (R26.81);Other abnormalities of gait and mobility (R26.89) Pain - Right/Left: Left Pain - part of body: Knee     Time: 1516-1600 PT Time Calculation (min) (ACUTE ONLY): 44 min  Charges:  $Gait Training: 8-22 mins $Therapeutic Exercise: 8-22 mins $Therapeutic Activity: 8-22 mins                    G Codes:        Collie Siad PT, DPT 07/27/2016, 4:19 PM

## 2016-07-27 NOTE — Care Management Note (Signed)
Case Management Note  Patient Details  Name: Benjamin Nicholson MRN: [id number] Date of Birth: 09/07/1950  Subjective/Objective:   POD # 1 left TKA. Met with patient at bedside. He is sitting in chair. Discussed discharge planning. Offered choice of home health agencies. He chose [facility] based on where he lives. Referral to Sarah at [facility] for PT. Patient has a walker.Pharmacy: [facility] ([phone]). Called Lovenox 40 mg # 14 no refills. Patient lives at home with his wife who will assist in caring for him along with his father in law.                   Action/Plan: [facility] for PT, Lovenox called in, No DME needs.   Expected Discharge Date:                  Expected Discharge Plan:  Home w Home Health Services  In-House Referral:     Discharge planning Services  CM Consult  Post Acute Care Choice:  Home Health Choice offered to:  Patient  DME Arranged:    DME Agency:     HH Arranged:  PT HH Agency:   ([facility])  Status of Service:  In process, will continue to follow  If discussed at Long Length of Stay Meetings, dates discussed:    Additional Comments:  Lisa M Jacobs, RN 07/27/2016, 1:21 PM  

## 2016-07-27 NOTE — Anesthesia Postprocedure Evaluation (Signed)
Anesthesia Post Note  Patient: Benjamin Nicholson  Procedure(s) Performed: Procedure(s) (LRB): COMPUTER ASSISTED TOTAL KNEE ARTHROPLASTY (Left)  Patient location during evaluation: Nursing Unit Anesthesia Type: Spinal Level of consciousness: oriented and awake and alert Pain management: pain level controlled Vital Signs Assessment: post-procedure vital signs reviewed and stable Respiratory status: spontaneous breathing, respiratory function stable and patient connected to nasal cannula oxygen Cardiovascular status: blood pressure returned to baseline and stable Postop Assessment: no headache and no backache Anesthetic complications: no     Last Vitals:  Vitals:   07/26/16 2047 07/27/16 0059  BP: (!) 143/85 (!) 156/85  Pulse: 75 71  Resp:  20  Temp: 36.8 C 36.8 C    Last Pain:  Vitals:   07/27/16 0607  TempSrc:   PainSc: 4                  Antiono Ettinger,  Clearnce Sorrel

## 2016-07-27 NOTE — Progress Notes (Signed)
Clinical Social Worker (CSW) received SNF consult. PT is recommending home health. RN case manager aware of above. Please reconsult if future social work needs arise. CSW signing off.   Cieara Stierwalt, LCSW [phone] 

## 2016-07-27 NOTE — Evaluation (Signed)
Occupational Therapy Evaluation Patient Details Name: Benjamin Nicholson MRN: [id number] DOB: 1950-09-11 Today's Date: 07/27/2016    History of Present Illness Pt is a 66 y/o M s/p L TKA.  Pt's PMH includes prepatellar bursitis.   Clinical Impression   Pt is a 66 year old male s/p L TKA.  Pt was independent in all ADLs prior to surgery and is eager to return to PLOF.  Pt currently requires min guard for LB dressing while in seated position using reacher and sock aid due to pain and limited AROM of L knee.  Pt is familiar with use of all AD and seen for evaluation only.  No further OT needs after discharge home. He has a Secondary school teacher and sock aid at home as well as a shower chair for bathing with assist from his wife who is also an OT.        Follow Up Recommendations  No OT follow up (pt seen for eval only)    Equipment Recommendations       Recommendations for Other Services       Precautions / Restrictions Precautions Precautions: Fall Restrictions Weight Bearing Restrictions: Yes LLE Weight Bearing: Weight bearing as tolerated      Mobility Bed Mobility Overal bed mobility: Modified Independent Bed Mobility: Supine to Sit     Supine to sit: HOB elevated;Modified independent (Device/Increase time)     General bed mobility comments: HOB slightly elevated but no use of bed rails.  Pt requires increased time and effort but no physical assist or cues needed.  Transfers Overall transfer level: Needs assistance Equipment used: Rolling walker (2 wheeled) Transfers: Sit to/from Stand Sit to Stand: Supervision         General transfer comment: Pt demonstrated proper technique for sit<>stand without requiring cues or assist.  Supervision provided for safety.    Balance Overall balance assessment: Needs assistance Sitting-balance support: No upper extremity supported;Feet supported Sitting balance-Leahy Scale: Good     Standing balance support: Bilateral upper extremity  supported;During functional activity Standing balance-Leahy Scale: Fair Standing balance comment: Pt able to stand statically without UE support but requires RW for dynamic activities                           ADL either performed or assessed with clinical judgement   ADL Overall ADL's : Needs assistance/impaired Eating/Feeding: Independent;Set up   Grooming: Wash/dry hands;Oral care;Brushing hair;Set up;Independent           Upper Body Dressing : Independent;Set up   Lower Body Dressing: Set up;Min guard;With adaptive equipment Lower Body Dressing Details (indicate cue type and reason): using reacher and sock aid Toilet Transfer: Supervision/safety;Set up;Min guard Toilet Transfer Details (indicate cue type and reason): BSC over toilet  Toileting- Clothing Manipulation and Hygiene: Min guard;Set up         General ADL Comments: Pt requires min guard when ambulating to and from bathroom with FWW and no LOB and good follow through of rec tech to bring knee forward before sitting down.     Vision         Perception     Praxis      Pertinent Vitals/Pain Pain Assessment: 0-10 Pain Score: 3  Pain Location: L knee Pain Descriptors / Indicators: Grimacing;Guarding;Aching Pain Intervention(s): Limited activity within patient's tolerance;Monitored during session;Premedicated before session;Repositioned     Hand Dominance Right   Extremity/Trunk Assessment Upper Extremity Assessment Upper Extremity Assessment: Overall [facility]  for tasks assessed   Lower Extremity Assessment Lower Extremity Assessment: Defer to PT evaluation       Communication Communication Communication: No difficulties   Cognition Arousal/Alertness: Awake/alert Behavior During Therapy: WFL for tasks assessed/performed Overall Cognitive Status: Within Functional Limits for tasks assessed                                     General Comments       Exercises Exercises:  Total Joint Total Joint Exercises Quad Sets: Strengthening;Both;10 reps;Supine;Other (comment) (with 3 second holds and very light manual overpressure) Gluteal Sets: Strengthening;Both;10 reps;Supine Straight Leg Raises: AROM;Left;Supine;10 reps;Other (comment) (with 5/10 focused on eccentric component) Knee Flexion: AAROM;Left;5 reps;Seated;Other (comment) (with 5 second holds) Goniometric ROM: -2 deg L knee extension AROM.  94 deg L knee flexion with AAROM.   Shoulder Instructions      Home Living Family/patient expects to be discharged to:: Private residence Living Arrangements: Spouse/significant other Available Help at Discharge: Family;Available PRN/intermittently Type of Home: House Home Access: Level entry     Home Layout: Two level;Able to live on main level with bedroom/bathroom Alternate Level Stairs-Number of Steps: Flight   Bathroom Shower/Tub: Walk-in shower;Curtain         Home Equipment: Environmental consultant - 2 wheels;Shower seat;Hand held shower head;Cane - single point;Bedside commode;Grab bars - tub/shower   Additional Comments: Pt has a reacher and sock aid at home to use for LB dressing.      Prior Functioning/Environment Level of Independence: Independent with assistive device(s)        Comments: Pt uses cane when ambulating on uneven surfaces.  Otherwise does not use AD.  He is ind with bathing, dressing.        OT Problem List:        OT Treatment/Interventions:      OT Goals(Current goals can be found in the care plan section) Acute Rehab OT Goals Patient Stated Goal: to be able to do more things around the house  OT Goal Formulation: With patient  OT Frequency:     Barriers to D/C:            Co-evaluation              AM-PAC PT "6 Clicks" Daily Activity     Outcome Measure Help from another person eating meals?: None Help from another person taking care of personal grooming?: None Help from another person toileting, which includes using  toliet, bedpan, or urinal?: A Little Help from another person bathing (including washing, rinsing, drying)?: A Little Help from another person to put on and taking off regular upper body clothing?: None Help from another person to put on and taking off regular lower body clothing?: A Little 6 Click Score: 21   End of Session Equipment Utilized During Treatment: Gait belt Nurse Communication: Other (comment) (IV beeping)  Activity Tolerance: Patient tolerated treatment well Patient left: in chair;with call bell/phone within reach;with chair alarm set  OT Visit Diagnosis: Pain;Muscle weakness (generalized) (M62.81) Pain - Right/Left: Left Pain - part of body: Knee                Time: 1610-9604 OT Time Calculation (min): 25 min Charges:  OT General Charges $OT Visit: 1 Procedure OT Evaluation $OT Eval Low Complexity: 1 Procedure OT Treatments $Self Care/Home Management : 8-22 mins G-Codes:     Chrys Racer, OTR/L ascom [phone] 07/27/16, 1:51  PM

## 2016-07-28 LAB — BASIC METABOLIC PANEL
Anion gap: 5 (ref 5–15)
BUN: 14 mg/dL (ref 6–20)
CHLORIDE: 99 mmol/L — AB (ref 101–111)
CO2: 30 mmol/L (ref 22–32)
CREATININE: 0.81 mg/dL (ref 0.61–1.24)
Calcium: 8.8 mg/dL — ABNORMAL LOW (ref 8.9–10.3)
GFR calc Af Amer: >60 mL/min (ref >60)
GFR calc non Af Amer: >60 mL/min (ref >60)
GLUCOSE: 110 mg/dL — AB (ref 65–99)
Potassium: 4.1 mmol/L (ref 3.5–5.1)
SODIUM: 134 mmol/L — AB (ref 135–145)

## 2016-07-28 LAB — CBC
HCT: 36.1 % — ABNORMAL LOW (ref 40.0–52.0)
HEMOGLOBIN: 12.1 g/dL — AB (ref 13.0–18.0)
MCH: 34.9 pg — AB (ref 26.0–34.0)
MCHC: 33.6 g/dL (ref 32.0–36.0)
MCV: 103.7 fL — AB (ref 80.0–100.0)
Platelets: 279 10*3/uL (ref 150–440)
RBC: 3.48 MIL/uL — ABNORMAL LOW (ref 4.40–5.90)
RDW: 13.4 % (ref 11.5–14.5)
WBC: 7.9 10*3/uL (ref 3.8–10.6)

## 2016-07-28 LAB — GLUCOSE, CAPILLARY
GLUCOSE-CAPILLARY: 108 mg/dL — AB (ref 65–99)
Glucose-Capillary: 122 mg/dL — ABNORMAL HIGH (ref 65–99)

## 2016-07-28 MED ORDER — TRAMADOL HCL 50 MG PO TABS: 50.0000 mg | ORAL_TABLET | ORAL | 0 refills | Status: DC | PRN

## 2016-07-28 MED ORDER — OXYCODONE HCL 5 MG PO TABS: 5.0000 mg | ORAL_TABLET | ORAL | 0 refills | Status: DC | PRN

## 2016-07-28 MED ORDER — ENOXAPARIN SODIUM 30 MG/0.3ML ~~LOC~~ SOLN: 40.0000 mg | SUBCUTANEOUS | 0 refills | Status: DC

## 2016-07-28 NOTE — Progress Notes (Signed)
Physical Therapy Treatment Patient Details Name: Benjamin Nicholson MRN: [id number] DOB: September 08, 1950 Today's Date: 07/28/2016    History of Present Illness Pt is a 66 y/o M s/p L TKA.  Pt's PMH includes prepatellar bursitis.    PT Comments    Benjamin Nicholson made good progress this session, specifically with AROM L knee.  He ambulated 59 ft with RW and supervision for safety.  Reviewed home layout and car transfer, pt verbalized understanding.  Provided pt with exercise handout.    Follow Up Recommendations  Home health PT     Equipment Recommendations  None recommended by PT    Recommendations for Other Services       Precautions / Restrictions Precautions Precautions: Fall Restrictions Weight Bearing Restrictions: Yes LLE Weight Bearing: Weight bearing as tolerated    Mobility  Bed Mobility               General bed mobility comments: Pt sitting EOB upon PT arrival  Transfers Overall transfer level: Needs assistance Equipment used: Rolling walker (2 wheeled) Transfers: Sit to/from Stand Sit to Stand: Supervision         General transfer comment: Pt demonstrated proper technique for sit<>stand without requiring cues or assist.  Supervision provided for safety as pt with mild instability.  Ambulation/Gait Ambulation/Gait assistance: Supervision Ambulation Distance (Feet): 90 Feet Assistive device: Rolling walker (2 wheeled) Gait Pattern/deviations: Step-to pattern;Step-through pattern;Decreased stride length;Decreased stance time - left;Decreased step length - right;Decreased weight shift to left;Antalgic Gait velocity: decreased Gait velocity interpretation: Below normal speed for age/gender General Gait Details: Pt requires cues to relax shoulders as he continues to push heavily through [place].  Cues for heel strike at initial contact, knee flexion during swing phase.     Stairs            Wheelchair Mobility    Modified Rankin (Stroke Patients Only)        Balance Overall balance assessment: Needs assistance Sitting-balance support: No upper extremity supported;Feet supported Sitting balance-Leahy Scale: Good     Standing balance support: Bilateral upper extremity supported;During functional activity Standing balance-Leahy Scale: Fair Standing balance comment: Pt able to stand statically without UE support but requires RW for dynamic activities                            Cognition Arousal/Alertness: Awake/alert Behavior During Therapy: WFL for tasks assessed/performed Overall Cognitive Status: Within Functional Limits for tasks assessed                                        Exercises Total Joint Exercises Quad Sets: Strengthening;Both;10 reps;Other (comment);Seated (with 3 second holds ) Hip ABduction/ADduction: AAROM;Left;10 reps;Seated Long Arc Quad: AAROM;Left;Strengthening;10 reps;Seated;Other (comment) (with focus on eccentric ) Knee Flexion: AAROM;Left;Seated;Other (comment);Other reps (comment) (4 reps with 5 second holds) Goniometric ROM: 0 deg L knee extension AROM. 103 deg L knee flexion with AAROM. Marching in Standing: AROM;Both;10 reps;Standing Other Exercises Other Exercises: Standing knee flexion AROM x6 with BUEs supported on RW    General Comments General comments (skin integrity, edema, etc.): Reviewed home layout and car transfer, pt verbalized understanding.  Provided pt with exercise handout.      Pertinent Vitals/Pain Pain Assessment: 0-10 Pain Score: 7  Pain Location: L knee Pain Descriptors / Indicators: Grimacing;Guarding;Aching Pain Intervention(s): Limited activity within patient's tolerance;Monitored during session;Repositioned;Other (comment);Premedicated  before session (polar care applied at end of session)    Home Living                      Prior Function            PT Goals (current goals can now be found in the care plan section) Acute Rehab PT  Goals Patient Stated Goal: to be able to do more things around the house  PT Goal Formulation: With patient Time For Goal Achievement: 08/09/16 Potential to Achieve Goals: Good Progress towards PT goals: Progressing toward goals    Frequency    BID      PT Plan Current plan remains appropriate    Co-evaluation              AM-PAC PT "6 Clicks" Daily Activity  Outcome Measure  Difficulty turning over in bed (including adjusting bedclothes, sheets and blankets)?: A Little Difficulty moving from lying on back to sitting on the side of the bed? : A Little Difficulty sitting down on and standing up from a chair with arms (e.g., wheelchair, bedside commode, etc,.)?: A Little Help needed moving to and from a bed to chair (including a wheelchair)?: A Little Help needed walking in hospital room?: A Little Help needed climbing 3-5 steps with a railing? : A Little 6 Click Score: 18    End of Session Equipment Utilized During Treatment: Gait belt Activity Tolerance: Patient limited by pain Patient left: with call bell/phone within reach;Other (comment);in chair;with chair alarm set (in bone foam and polar care in place) Nurse Communication: Mobility status PT Visit Diagnosis: Pain;Unsteadiness on feet (R26.81);Other abnormalities of gait and mobility (R26.89) Pain - Right/Left: Left Pain - part of body: Knee     Time: 1001-1043 PT Time Calculation (min) (ACUTE ONLY): 42 min  Charges:  $Gait Training: 23-37 mins $Therapeutic Exercise: 8-22 mins                    G Codes:       Collie Siad PT, DPT 07/28/2016, 10:58 AM

## 2016-07-28 NOTE — Care Management Note (Signed)
Case Management Note  Patient Details  Name: TRANQUILINO FISCHLER MRN: [id number] Date of Birth: January 02, 1951  Subjective/Objective:   Patient will have a copay with [facility]. He is agreeable to [facility]. Referral to [facility] with [facility] for PT.                 Action/Plan: Cost of Lovenox is $ 41.26. Patient updated and agreeable to POC.    Expected Discharge Date:  07/28/16               Expected Discharge Plan:  [place]  In-House Referral:     Discharge planning Services  CM Consult  Post Acute Care Choice:  Home Health Choice offered to:  Patient  DME Arranged:    DME Agency:     HH Arranged:  PT [place]:  [place]  Status of Service:  In process, will continue to follow  If discussed at Long Length of Stay Meetings, dates discussed:    Additional Comments:  Jolly Mango, RN 07/28/2016, 9:31 AM

## 2016-07-28 NOTE — Discharge Planning (Signed)
Patient IV removed.  Discharge papers given, explained and educated.  Instructed on wound care, dressing changes, s/sx of infection and lovenox instructions.  Teach-back indicated patient understood and was able to maintain self at home safely.  RN assessment and VS revealed stability for discharge.  Informed of suggested FU appts and appt made.  Given signed scripts for pain.  When ready and ride arrives - patient will be wheeled to front and family transferring home via car. Family supposed to arrive between 3-4pm.

## 2016-07-28 NOTE — Plan of Care (Signed)
Problem: Safety: Goal: Ability to remain free from injury will improve Outcome: Progressing Pt alert and oriented. Rings for assistance to ambulate.  Problem: Pain Managment: Goal: General experience of comfort will improve Outcome: Progressing Pain control with oral pain medication.  Problem: Skin Integrity: Goal: Risk for impaired skin integrity will decrease Outcome: Progressing Surgical dressing remaining dry and intact.  Problem: Activity: Goal: Risk for activity intolerance will decrease Outcome: Progressing Pt was up and ambulating to bathroom with assistance.  Problem: Nutrition: Goal: Adequate nutrition will be maintained Outcome: Progressing Eating and drinking without difficulty.

## 2016-07-28 NOTE — Progress Notes (Signed)
   Subjective: 2 Days Post-Op Procedure(s) (LRB): COMPUTER ASSISTED TOTAL KNEE ARTHROPLASTY (Left) Patient reports pain as moderate.   Having increased pain more so than yesterday. Appears Exparel has worn off. Patient is well, and has had no acute complaints or problems Doing well with therapy  Plan is to go Home after hospital stay. no nausea and no vomiting Patient denies any chest pains or shortness of breath. Objective: Vital signs in last 24 hours: Temp:  [97.7 F (36.5 C)-98.5 F (36.9 C)] 98.5 F (36.9 C) (05/01 2308) Pulse Rate:  [67-86] 86 (05/01 2308) Resp:  [18-20] 18 (05/01 2308) BP: (122-149)/(69-91) 126/69 (05/01 2308) SpO2:  [88 %-98 %] 98 % (05/01 2308) well approximated incision Heels are non tender and elevated off the bed using rolled towels Intake/Output from previous day: 05/01 0701 - 05/02 0700 In: 480 [P.O.:480] Out: 2085 [Urine:2025; Drains:60] Intake/Output this shift: No intake/output data recorded.   Recent Labs  07/27/16 0624 07/28/16 0455  HGB 13.0 12.1*    Recent Labs  07/27/16 0624 07/28/16 0455  WBC 9.3 7.9  RBC 3.69* 3.48*  HCT 38.0* 36.1*  PLT 297 279    Recent Labs  07/27/16 0624 07/28/16 0455  NA 136 134*  K 3.7 4.1  CL 104 99*  CO2 26 30  BUN 13 14  CREATININE 0.85 0.81  GLUCOSE 126* 110*  CALCIUM 8.7* 8.8*   No results for input(s): LABPT, INR in the last 72 hours.  EXAM General - Patient is Alert, Appropriate and Oriented Extremity - Neurologically intact Neurovascular intact Sensation intact distally Intact pulses distally Dorsiflexion/Plantar flexion intact No cellulitis present Compartment soft Dressing - scant drainage Motor Function - intact, moving foot and toes well on exam.    Past Medical History:  Diagnosis Date  . Asthma   . Diabetes mellitus without complication ([place])   . Diverticulosis   . Dyspnea   . Environmental and seasonal allergies   . GERD (gastroesophageal reflux disease)    . Hypertension   . Hypogonadism in male   . Osteoarthritis   . Prepatellar bursitis   . Sleep apnea     Assessment/Plan: 2 Days Post-Op Procedure(s) (LRB): COMPUTER ASSISTED TOTAL KNEE ARTHROPLASTY (Left) Active Problems:   S/P total knee arthroplasty  Estimated body mass index is 29.71 kg/m as calculated from the following:   Height as of this encounter: 5\' 11"  (1.803 m).   Weight as of this encounter: 96.6 kg (213 lb). Up with therapy Discharge home with home health  Labs: Were reviewed DVT Prophylaxis - Lovenox, Foot Pumps and TED hose Weight-Bearing as tolerated to left leg Hemovac was discontinued on today's visit. Please wash the operative leg and apply TED stockings. Please change dressing to operative leg prior to being discharged and give the patient 2 extra honeycomb dressings to take home.  Jillyn Ledger. [place] 07/28/2016, 7:15 AM

## 2016-07-29 ENCOUNTER — Encounter: Payer: Self-pay | Admitting: *Deleted

## 2016-07-29 ENCOUNTER — Other Ambulatory Visit: Payer: Self-pay | Admitting: *Deleted

## 2016-07-29 DIAGNOSIS — I10 Essential (primary) hypertension: Secondary | ICD-10-CM | POA: Diagnosis not present

## 2016-07-29 DIAGNOSIS — E119 Type 2 diabetes mellitus without complications: Secondary | ICD-10-CM | POA: Diagnosis not present

## 2016-07-29 DIAGNOSIS — Z471 Aftercare following joint replacement surgery: Secondary | ICD-10-CM | POA: Diagnosis not present

## 2016-07-29 NOTE — Patient Outreach (Addendum)
[facility]) Care Management  07/29/2016  PONCE SKILLMAN Feb 27, 1951 [id number]   Subjective: Telephone call to patient's home / mobile number, spoke with patient, and HIPAA verified.  Discussed [facility] Care Management [facility] Transition of care follow up, patient voiced understanding, and is in agreement to follow up.   Patient states he is doing great, has a supportive family, started home physical therapy today, and  has follow up appointment in place with surgeon's physician assistant on 08/10/16.    Patient states he does not have any transition of care, care coordination, disease management, disease monitoring, transportation, community resource, or pharmacy needs at this time.   States he is very appreciative of the follow up and is in agreement to receive [place] Management information.    Objective: Per chart review, patient hospitalized  07/26/16 - 07/28/16 for PRIMARY OSTEOARTHRITIS OF LEFT KNEE. Status post Left total knee arthroplasty using computer-assisted navigation on 07/26/16.  Patient also has a history of diabetes, hypertension, and asthma.  [facility] Care Management completed preoperative follow up on 07/13/16.   [facility] Care Management completed Link to Wellness program follow up on 11/28/2015.     Assessment: Received [facility] Transition of care referral on 07/27/16 via [employer] report.   Transition of care follow up completed, no care management needs, and will proceed with case closure.    Plan:  RNCM will send patient successful outreach letter, [facility] pamphlet, and magnet. RNCM will send case closure due to follow up completed / no care management needs request to [facility] [place] Management.    Valine Drozdowski H. Annia Friendly, BSN, [place] Management [facility] Telephonic CM Phone: [phone] Fax: [phone]

## 2016-07-30 DIAGNOSIS — Z471 Aftercare following joint replacement surgery: Secondary | ICD-10-CM | POA: Diagnosis not present

## 2016-07-30 DIAGNOSIS — I950 Idiopathic hypotension: Secondary | ICD-10-CM | POA: Diagnosis not present

## 2016-07-30 DIAGNOSIS — E119 Type 2 diabetes mellitus without complications: Secondary | ICD-10-CM | POA: Diagnosis not present

## 2016-07-30 DIAGNOSIS — I10 Essential (primary) hypertension: Secondary | ICD-10-CM | POA: Diagnosis not present

## 2016-08-02 DIAGNOSIS — I10 Essential (primary) hypertension: Secondary | ICD-10-CM | POA: Diagnosis not present

## 2016-08-02 DIAGNOSIS — E119 Type 2 diabetes mellitus without complications: Secondary | ICD-10-CM | POA: Diagnosis not present

## 2016-08-02 DIAGNOSIS — Z471 Aftercare following joint replacement surgery: Secondary | ICD-10-CM | POA: Diagnosis not present

## 2016-08-04 DIAGNOSIS — I10 Essential (primary) hypertension: Secondary | ICD-10-CM | POA: Diagnosis not present

## 2016-08-04 DIAGNOSIS — E119 Type 2 diabetes mellitus without complications: Secondary | ICD-10-CM | POA: Diagnosis not present

## 2016-08-04 DIAGNOSIS — Z471 Aftercare following joint replacement surgery: Secondary | ICD-10-CM | POA: Diagnosis not present

## 2016-08-06 DIAGNOSIS — I10 Essential (primary) hypertension: Secondary | ICD-10-CM | POA: Diagnosis not present

## 2016-08-06 DIAGNOSIS — E119 Type 2 diabetes mellitus without complications: Secondary | ICD-10-CM | POA: Diagnosis not present

## 2016-08-06 DIAGNOSIS — Z471 Aftercare following joint replacement surgery: Secondary | ICD-10-CM | POA: Diagnosis not present

## 2016-08-09 DIAGNOSIS — E119 Type 2 diabetes mellitus without complications: Secondary | ICD-10-CM | POA: Diagnosis not present

## 2016-08-09 DIAGNOSIS — I10 Essential (primary) hypertension: Secondary | ICD-10-CM | POA: Diagnosis not present

## 2016-08-09 DIAGNOSIS — Z471 Aftercare following joint replacement surgery: Secondary | ICD-10-CM | POA: Diagnosis not present

## 2016-08-10 DIAGNOSIS — R1032 Left lower quadrant pain: Secondary | ICD-10-CM | POA: Diagnosis not present

## 2016-08-10 DIAGNOSIS — R1011 Right upper quadrant pain: Secondary | ICD-10-CM | POA: Diagnosis not present

## 2016-08-10 DIAGNOSIS — K5903 Drug induced constipation: Secondary | ICD-10-CM | POA: Diagnosis not present

## 2016-08-10 DIAGNOSIS — T402X5A Adverse effect of other opioids, initial encounter: Secondary | ICD-10-CM | POA: Diagnosis not present

## 2016-08-10 DIAGNOSIS — M1712 Unilateral primary osteoarthritis, left knee: Secondary | ICD-10-CM | POA: Diagnosis not present

## 2016-08-10 DIAGNOSIS — I10 Essential (primary) hypertension: Secondary | ICD-10-CM | POA: Diagnosis not present

## 2016-08-11 DIAGNOSIS — R1011 Right upper quadrant pain: Secondary | ICD-10-CM | POA: Diagnosis not present

## 2016-08-11 DIAGNOSIS — K8020 Calculus of gallbladder without cholecystitis without obstruction: Secondary | ICD-10-CM | POA: Diagnosis not present

## 2016-08-11 DIAGNOSIS — R1031 Right lower quadrant pain: Secondary | ICD-10-CM | POA: Diagnosis not present

## 2016-08-11 DIAGNOSIS — K8689 Other specified diseases of pancreas: Secondary | ICD-10-CM | POA: Diagnosis not present

## 2016-08-11 DIAGNOSIS — R918 Other nonspecific abnormal finding of lung field: Secondary | ICD-10-CM | POA: Diagnosis not present

## 2016-08-11 DIAGNOSIS — E119 Type 2 diabetes mellitus without complications: Secondary | ICD-10-CM | POA: Diagnosis not present

## 2016-08-11 DIAGNOSIS — J4530 Mild persistent asthma, uncomplicated: Secondary | ICD-10-CM | POA: Diagnosis not present

## 2016-08-11 DIAGNOSIS — K429 Umbilical hernia without obstruction or gangrene: Secondary | ICD-10-CM | POA: Diagnosis not present

## 2016-08-11 DIAGNOSIS — R0989 Other specified symptoms and signs involving the circulatory and respiratory systems: Secondary | ICD-10-CM | POA: Diagnosis not present

## 2016-08-11 DIAGNOSIS — I10 Essential (primary) hypertension: Secondary | ICD-10-CM | POA: Diagnosis not present

## 2016-08-11 DIAGNOSIS — J45909 Unspecified asthma, uncomplicated: Secondary | ICD-10-CM | POA: Diagnosis not present

## 2016-08-11 DIAGNOSIS — M170 Bilateral primary osteoarthritis of knee: Secondary | ICD-10-CM | POA: Diagnosis not present

## 2016-08-11 DIAGNOSIS — R109 Unspecified abdominal pain: Secondary | ICD-10-CM | POA: Diagnosis not present

## 2016-08-11 DIAGNOSIS — K760 Fatty (change of) liver, not elsewhere classified: Secondary | ICD-10-CM | POA: Diagnosis not present

## 2016-08-11 DIAGNOSIS — N200 Calculus of kidney: Secondary | ICD-10-CM | POA: Diagnosis not present

## 2016-08-11 DIAGNOSIS — R143 Flatulence: Secondary | ICD-10-CM | POA: Diagnosis not present

## 2016-08-11 DIAGNOSIS — K5900 Constipation, unspecified: Secondary | ICD-10-CM | POA: Diagnosis not present

## 2016-08-11 DIAGNOSIS — R0602 Shortness of breath: Secondary | ICD-10-CM | POA: Diagnosis not present

## 2016-08-11 DIAGNOSIS — R1032 Left lower quadrant pain: Secondary | ICD-10-CM | POA: Diagnosis not present

## 2016-08-11 DIAGNOSIS — K828 Other specified diseases of gallbladder: Secondary | ICD-10-CM | POA: Diagnosis not present

## 2016-08-13 ENCOUNTER — Other Ambulatory Visit: Payer: Self-pay | Admitting: Pharmacist

## 2016-08-13 NOTE — Patient Outreach (Signed)
[facility]) Care Management  [place]   08/13/2016  Benjamin Nicholson [birth year] [id number]  Subjective: Patient presents today for diabetes follow-up as part of the employer-sponsored Link to Wellness program.  Current diabetes regimen includes metformin XR 1500 mg daily.  Patient also continues on daily aspirin, lisinopril and atorvastatin.  Patient is currently on COBRA until September and then plans to transition to Medicare.  Patient was recently hospitalized for gallbladder stone and knee replacement and just returned home today.  Patient reports he is doing much better and denies issues.   Patient reported dietary habits: believes he is adhering to low carbohydrate diet most of the time but would give himself a "B-" grade   Patient reported exercise habits: Just had knee surgery - has had home sessions of physical therapy and about to start outpatient physical therapy  Patient denies hypoglycemic events.  Patient denies nocturia.  Patient denies pain/burning upon urination.   Patient reported self monitored blood glucose frequency couple times a week.  Reports are well controlled when he checks.   Patient was recently discharged from hospital and all medications have been reviewed.  Objective:  Lab Results  Component Value Date   HGBA1C 6.4 (H) 07/14/2016   Lipid Panel     Component Value Date/Time   CHOL 140 06/29/2013 1159   TRIG 86 06/29/2013 1159   HDL 53 06/29/2013 1159   VLDL 17 06/29/2013 1159   LDLCALC 70 06/29/2013 1159    Encounter Medications: Outpatient Encounter Prescriptions as of 08/13/2016  Medication Sig Note  . albuterol (PROVENTIL HFA;VENTOLIN HFA) 108 (90 BASE) MCG/ACT inhaler Inhale 2 puffs into the lungs every 6 (six) hours as needed for wheezing or shortness of breath.   Marland Kitchen amLODipine (NORVASC) 5 MG tablet Take 5 mg by mouth daily.   Marland Kitchen aspirin EC 81 MG tablet Take 81 mg by mouth daily.   Marland Kitchen atorvastatin (LIPITOR) 20 MG tablet  Take 20 mg by mouth daily.   . budesonide-formoterol (SYMBICORT) 160-4.5 MCG/ACT inhaler Inhale 2 puffs into the lungs 2 (two) times daily.   . calcium carbonate (TUMS - DOSED IN MG ELEMENTAL CALCIUM) 500 MG chewable tablet Chew 2 tablets by mouth daily as needed for indigestion or heartburn.   . fluticasone (FLONASE) 50 MCG/ACT nasal spray Place 1 spray into both nostrils daily as needed for allergies or rhinitis.   . Glucos-Chondroit-Collag-Hyal (GLUCOSAMINE CHONDROIT-COLLAGEN) CAPS Take 2 capsules by mouth 2 (two) times daily.   . Lactobacillus (PROBIOTIC ACIDOPHILUS PO) Take 2 tablets by mouth every evening.   Marland Kitchen lisinopril-hydrochlorothiazide (PRINZIDE,ZESTORETIC) 20-25 MG tablet Take 1 tablet by mouth daily.   Marland Kitchen loratadine (CLARITIN) 10 MG tablet Take 10 mg by mouth daily as needed for allergies.   . metFORMIN (GLUCOPHAGE-XR) 500 MG 24 hr tablet Take 1,500 mg by mouth at bedtime.   . Multiple Vitamin (MULTIVITAMIN WITH MINERALS) TABS tablet Take 0.5 tablets by mouth 2 (two) times daily.    . NONFORMULARY OR COMPOUNDED ITEM Take 4 tablets by mouth 2 (two) times daily. Alkalete   . omeprazole (PRILOSEC) 20 MG capsule Take 20 mg by mouth 2 (two) times daily as needed.    Marland Kitchen oxyCODONE (OXY IR/ROXICODONE) 5 MG immediate release tablet Take 1-2 tablets (5-10 mg total) by mouth every 4 (four) hours as needed for severe pain or breakthrough pain.   . polyethylene glycol (MIRALAX / GLYCOLAX) packet Take 17 g by mouth daily as needed.   . senna-docusate (SENOKOT-S) 8.6-50 MG  tablet Take 2 tablets by mouth daily as needed.   . testosterone (ANDROGEL) 50 MG/5GM (1%) GEL Place 2.5 g onto the skin daily.  07/09/2016: 1/2 a tube  . VIAGRA 50 MG tablet Take 50 mg by mouth as needed for erectile dysfunction.    . [DISCONTINUED] enoxaparin (LOVENOX) 30 MG/0.3ML injection Inject 0.4 mLs (40 mg total) into the skin daily.   . [DISCONTINUED] traMADol (ULTRAM) 50 MG tablet Take 1-2 tablets (50-100 mg total) by mouth  every 4 (four) hours as needed for moderate pain.    No facility-administered encounter medications on file as of 08/13/2016.     Functional Status: In your present state of health, do you have any difficulty performing the following activities: 07/26/2016 07/14/2016  Hearing? Tempie Donning  Vision? N N  Difficulty concentrating or making decisions? Tempie Donning  Walking or climbing stairs? Y Y  Dressing or bathing? N N  Doing errands, shopping? N N  Some recent data might be hidden    Fall/Depression Screening: Fall Risk  11/28/2015  Falls in the past year? Yes  Number falls in past yr: 1  Injury with Fall? Yes  Follow up Falls evaluation completed   PHQ 2/9 Scores 11/28/2015  PHQ - 2 Score 0     Assessment:  Diabetes: Most recent A1C was 6.4% which is at at goal of less than 7%.    Plan/Goals for Next Visit: Discussed low carbohydrate diet and continuing physical therapy Discussed importance of medication adherence.  [facility] pharmacist performed post discharge medication reconciliation today.   Next appointment to see me is: Patients cobra will end in September   Bennye Alm, PharmD [facility] PGY2 Pharmacy Resident [phone]  [facility] CM Care Plan Problem One     Most Recent Value  Care Plan Problem One  Nonadherence to Exercise related to diabetes management  Role Documenting the Problem One  Clinical Pharmacist  Care Plan for Problem One  Active  [facility] Long Term Goal (31-90 days)  Patient will continue physical therapy over the next 90 days and then continue exercise as directed by his physician  [place] Term Goal Start Date  08/13/16  Interventions for Problem One Long Term Goal  Discussed low carbohydrate and exercise goal of >150 minutes/week of mild-moderate exercise and strategies to exercise without hurting his knees.

## 2016-08-19 DIAGNOSIS — Z471 Aftercare following joint replacement surgery: Secondary | ICD-10-CM | POA: Diagnosis not present

## 2016-08-19 DIAGNOSIS — Z96652 Presence of left artificial knee joint: Secondary | ICD-10-CM | POA: Diagnosis not present

## 2016-08-26 DIAGNOSIS — Z96652 Presence of left artificial knee joint: Secondary | ICD-10-CM | POA: Diagnosis not present

## 2016-08-26 DIAGNOSIS — Z471 Aftercare following joint replacement surgery: Secondary | ICD-10-CM | POA: Diagnosis not present

## 2016-09-02 DIAGNOSIS — R1011 Right upper quadrant pain: Secondary | ICD-10-CM | POA: Diagnosis not present

## 2016-09-02 DIAGNOSIS — Z96652 Presence of left artificial knee joint: Secondary | ICD-10-CM | POA: Diagnosis not present

## 2016-09-02 DIAGNOSIS — Z471 Aftercare following joint replacement surgery: Secondary | ICD-10-CM | POA: Diagnosis not present

## 2016-09-14 DIAGNOSIS — Z96652 Presence of left artificial knee joint: Secondary | ICD-10-CM | POA: Diagnosis not present

## 2016-09-30 DIAGNOSIS — Z96652 Presence of left artificial knee joint: Secondary | ICD-10-CM | POA: Diagnosis not present

## 2016-09-30 DIAGNOSIS — Z471 Aftercare following joint replacement surgery: Secondary | ICD-10-CM | POA: Diagnosis not present

## 2016-11-04 DIAGNOSIS — H2513 Age-related nuclear cataract, bilateral: Secondary | ICD-10-CM | POA: Diagnosis not present

## 2016-11-04 DIAGNOSIS — E119 Type 2 diabetes mellitus without complications: Secondary | ICD-10-CM | POA: Diagnosis not present

## 2016-12-16 DIAGNOSIS — K219 Gastro-esophageal reflux disease without esophagitis: Secondary | ICD-10-CM | POA: Diagnosis not present

## 2016-12-16 DIAGNOSIS — Z23 Encounter for immunization: Secondary | ICD-10-CM | POA: Diagnosis not present

## 2016-12-16 DIAGNOSIS — E291 Testicular hypofunction: Secondary | ICD-10-CM | POA: Diagnosis not present

## 2016-12-16 DIAGNOSIS — I10 Essential (primary) hypertension: Secondary | ICD-10-CM | POA: Diagnosis not present

## 2016-12-16 DIAGNOSIS — Z96652 Presence of left artificial knee joint: Secondary | ICD-10-CM | POA: Diagnosis not present

## 2016-12-16 DIAGNOSIS — Z0000 Encounter for general adult medical examination without abnormal findings: Secondary | ICD-10-CM | POA: Diagnosis not present

## 2016-12-16 DIAGNOSIS — E119 Type 2 diabetes mellitus without complications: Secondary | ICD-10-CM | POA: Diagnosis not present

## 2016-12-16 DIAGNOSIS — J4530 Mild persistent asthma, uncomplicated: Secondary | ICD-10-CM | POA: Diagnosis not present

## 2017-09-19 ENCOUNTER — Ambulatory Visit: Payer: Medicare Other | Admitting: Occupational Therapy

## 2017-09-21 ENCOUNTER — Other Ambulatory Visit: Payer: Self-pay

## 2017-09-21 ENCOUNTER — Encounter: Payer: Self-pay | Admitting: Occupational Therapy

## 2017-09-21 ENCOUNTER — Ambulatory Visit: Payer: Medicare Other | Attending: Orthopedic Surgery | Admitting: Occupational Therapy

## 2017-09-21 DIAGNOSIS — I890 Lymphedema, not elsewhere classified: Secondary | ICD-10-CM | POA: Diagnosis present

## 2017-09-21 NOTE — Patient Instructions (Signed)

## 2017-09-22 NOTE — Therapy (Signed)
[place] MAIN [facility] SERVICES [address] [place], [place], [address] Phone: [phone]   Fax:  [phone]  Occupational Therapy Evaluation: BLE Lymphedema  Patient Details  Name: Benjamin Nicholson MRN: [id number] Date of Birth: 09-13-1950 Referring Provider: Jeneen Rinks P. Marry Guan, MD   Encounter Date: 09/21/2017  OT End of Session - 09/22/17 1804    Visit Number  1    Number of Visits  36    Date for OT Re-Evaluation  12/20/17    OT Start Time  0805    OT Stop Time  0925    OT Time Calculation (min)  80 min    Activity Tolerance  Patient tolerated treatment well    Behavior During Therapy  [facility] for tasks assessed/performed       Past Medical History:  Diagnosis Date  . Asthma   . Diabetes mellitus without complication ([place])   . Diverticulosis   . Dyspnea   . Environmental and seasonal allergies   . GERD (gastroesophageal reflux disease)   . Hypertension   . Hypogonadism in male   . Osteoarthritis   . Prepatellar bursitis   . Sleep apnea     Past Surgical History:  Procedure Laterality Date  . ANKLE SURGERY Left   . COLONOSCOPY WITH PROPOFOL N/A 09/20/2014   Procedure: COLONOSCOPY WITH PROPOFOL;  Surgeon: Lollie Sails, MD;  Location: [facility] ENDOSCOPY;  Service: Endoscopy;  Laterality: N/A;  . EYE SURGERY    . KNEE ARTHROPLASTY Left 07/26/2016   Procedure: COMPUTER ASSISTED TOTAL KNEE ARTHROPLASTY;  Surgeon: Dereck Leep, MD;  Location: [facility] ORS;  Service: Orthopedics;  Laterality: Left;  . LEG SURGERY Left    Fracture    There were no vitals filed for this visit.  Subjective Assessment - 09/22/17 1805    Subjective   Mr. KANNAN PROIA is referred to Occupational Therapy for evaluation and treatment of BLE lymphedema by Dereck Leep, MD. Mr. Oyen reports mild, fluctuating, chronic swelling of the full LLE sfor several months after his knee replacement. He reports he has only recenty noted RLE swelling  below the knee. Pt has tried  compression stockings in the past , but with poor tolerance due to bunching and tourniquet effect at the ankles. He has not previously undergone lymphedema treatment. He dfenies a diagnosis of venous insufficiency.     Patient is accompained by:  Family member    Pertinent History  HTN, astma, type II diabetes, ORIF L ankle , L TKA 2018, R TKA pending    Limitations  difficulty fitting street sjoes and lower body clothing, leg swelling and sensations of heaviness, fullness, and tightness increase w/ extended dependent positioning when driving, riding in car, sitting , standing, walking; difficulty reaching feet to don      LB clothing and shoes    Patient Stated Goals  reduce kleg swelling and keep it down, especially in my R leg in prep for knee surgery    Currently in Pain?  No/denies    Pain Location  Leg    Pain Orientation  Right;Left    Pain Descriptors / Indicators  Pins and needles    Pain Type  Acute pain;Neuropathic pain    Pain Onset  1 to 4 weeks ago    Pain Frequency  Intermittent    Aggravating Factors   gravity dependent position    Pain Relieving Factors  elevation, rubbing    Effect of Pain on Daily Activities  decreased activity tolerance      Mild Stage I , BLE lymphedema (LE) , L>R, 2/2 unknown        etiology ( possible CVI; HTN meds may be contributing factor) Skin Description Hyper-Keratosis Peau' de Orange Shiny Tight Fibrotic Fatty Doughy Indurated     x X Thickened lymphatic congestion       Hydration Dry Flaky Erythema Other   x      Color Redness Present Pallor Blanching Hemosiderin Staining Other   x  x      Odor Malodorous Yeast  Absent      x   Temperature Warm Cool Normal    x    Pitting Edema   1+ 2+ 3+ 4+ Non-pitting       X L>R    Girth Symmetrical Asymmetrical Other Distribution    L>R Foot to groin on L; TT to foot on L   Stemmer Sign Positive Negative     Strong +    Lymphorrea History Of:  Present Absent   denies  x    Wounds  History Of Present Absent Venous Arterial Pressure Size     x          Signs of Infection Redness Warmth Erythema Acute Swelling Drainage Borders                   Scars Adhesions Hypersensitivity        Sensation Light Touch Deep pressure Hypersensitivty   Present Impaired Present Impaired Present Impaired    x    X Soles of feet    x     [facility] OT Assessment - 09/22/17 0001      Assessment   Medical Diagnosis  mild, BLE lymphedema, L>R, stage I    Onset Date/Surgical Date  09/14/17    Hand Dominance  Right    Prior Therapy  no pror CDT, hx poor tolerance of knee length compression garments      Precautions   Precautions  Fall    Precaution Comments  lymphedema precautions for hx asthma and DM- no MLD to neck and monitor skin carefully due to diabetes      Restrictions   Weight Bearing Restrictions  No      Balance Screen   Has the patient fallen in the past 6 months  No    Has the patient had a decrease in activity level because of a fear of falling?   No    Is the patient reluctant to leave their home because of a fear of falling?   No      Home  Environment   Family/patient expects to be discharged to:  Private residence    Living Arrangements  Spouse/significant other    Available Help at Discharge  Family    Type of [place]  One level    Lives With  Spouse;Daughter      Prior Function   Level of Independence  Independent;Independent with basic ADLs;Independent with household mobility without device;Independent with community mobility without device;Independent with homemaking with ambulation;Independent with gait;Independent with transfers    Vocation  Retired;Other (comment) retired Secretary/administrator. Spouse is also OT and still working    [employer] , walking, driving , sitting, home management tasks, yardwork,     Leisure  family time      IADL   Prior Level of Function Shopping  I    Shopping  Takes  care of all shopping needs independently duration limited by progressive leg swelling and pain    Prior Level of Function Light Housekeeping  I    Light Housekeeping  Does personal laundry completely;Maintains house alone or with occasional assistance    Prior Level of Function Meal Prep  I    Meal Prep  Plans, prepares and serves adequate meals independently    Prior Level of Function Community Mobility  I    Investment banker, corporate own Art therapist Status  Independent    Mobility Status Comments  duration limited by progressive leg swelling and pain      Activity Tolerance   Activity Tolerance  Endurance does not limit participation in activity    Activity Tolerance Comments  duration limited by progressive leg swelling and pain      Cognition   Overall Cognitive Status  Within Functional Limits for tasks assessed      Posture/Postural Control   Posture/Postural Control  No significant limitations      Coordination   Gross Motor Movements are Fluid and Coordinated  Yes    Fine Motor Movements are Fluid and Coordinated  Yes      AROM   Overall AROM   Deficits;Other (comment) decreased hip AROM- difficulty reaching feet and distal legs      Strength   Overall Strength  Within functional limits for tasks performed                      OT Education - 09/21/17 1100    Education Details  Provided Pt/caregiver skilled education regarding lymphatic structure and function, various lymphedema etiologies, obnset patterns and progression,  impact of obesity on lymphatic system function, and  discussed Complete Decongestive Therapy for LE care, including 4 components of Intensive and Self Management Phases.  Discussed   Importance of daily daily LE self care to retain clinical gains and limit progression.  Discussed lymphedema precautions, cellulitis risk,  Provided printed Lymphedema Workbook for reference. Extensive Pt and spouse edu this morning for  proper application of compression wraps using correct gradient techniques               and short stretch supplies.    Person(s) Educated  Patient;Spouse    Methods  Explanation;Demonstration;Handout;Tactile cues;Verbal cues    Comprehension  Verbalized understanding;Returned demonstration;Need further instruction;Verbal cues required;Tactile cues required          OT Long Term Goals - 09/21/17 1020      OT LONG TERM GOAL #1   Title  Pt independent w/ lymphedema precautions/prevention principals and using printed reference to limit LE progression and infection risk.    Baseline  Max A    Time  2    Period  Weeks    Status  New    Target Date  -- 2nd OT visit      OT LONG TERM GOAL #2   Title  Lymphedema (LE) management/ self-care: Pt able to apply thigh length gradient compression wraps with mod assist by spouse  between OT visits using correct gradient techniques within 2 visits to achieve optimal limb volume reduction during Intensive Phase of CDT and optimal self-management over time.    Baseline  max A    Time  2    Period  Weeks    Status  New    Target Date  -- 2nd  OT visit      OT LONG TERM GOAL #3   Title  Lymphedema (LE) management/ self-care:  Pt to achieve at least 10%  limb volume reductions bilaterally during Intensive Phase CDT to limit LE progression, to decrease infection risk and to improve functional mobility and ambulation essential for optimal ADLs performance.    Baseline  max A    Time  12    Period  Weeks    Status  New    Target Date  12/20/17      OT LONG TERM GOAL #4   Title  Lymphedema (LE) management/ self-care:  Pt to tolerate daily compression garments and/ or HOS devices in keeping w/ prescribed wear regime within 1 week of issue date to restore normal limb shape to reduce tissue density and protein build up leading to progression.    Baseline  max A    Time  12    Period  Weeks    Status  New    Target Date  12/20/17      OT LONG TERM GOAL  #5   Title  Lymphedema (LE) management/ self-care:  During Management Phase CDT Pt to sustain reduced limb volumes achieved during Intensive Phase CDT at follow up visits during Management Phase CDT within 3% using LE self-care home program components as directed ( simple self MLD, skin care, ther ex and daily/ nightly compression garments/ devices) .    Baseline  max A    Time  6    Period  Months    Status  New    Target Date  03/20/18            Plan - 09/21/17 1015    Clinical Impression Statement  Jakhai Fant is a 89 y o male presenting with mild, stage I, BLE swelling, L>R, extending from toes to groin on the L and from toes to popliteal fossa on the R.  Etiology is unknown CVI is a possible  contributing factor, as is HTN meds. Pt experiencing chromnic, progressive, fluctuationg LLE swelling for for the past year with worsening as the day goes on. LLE swelling reduces with elevation  overnight , but no longer resolves. LLE swelling fluctuates  is concentrated primarily below the knee. Pitting 3-4+ is noted bilaterally at anterior leg  and  at medial and lateral maleoli. Skin is well hydrated. Signs and symptoms of infection are absent.  Leg swelling and associated pain     limit Pt's ability to perform some basic and instrumental ADLs, contribute to impaired functional mobility and ambulation, impaires standing , walking and sitting tolerance, participation in leisure pursuits and productive activities, and limit participation in social events requiring extended standing, walking and/ or sitting with feet in dependent position. Pt will benefit from skilled OT to reduce leg swelling and limit progression, to limit infection risk and to limit wound heaing. Without skilled OT for CDT, leg swelling  will worsen, lymphedema will progress, and further functional decline is expected.    Occupational performance deficits (Please refer to evaluation for details):  ADL's;IADL's;Work;Leisure;Social  Participation    Rehab Potential  Excellent    OT Frequency  1x / week    OT Duration  Other (comment) and PRN to assist w/ swelling reduction  p TKA and garment replacements    OT Treatment/Interventions  Self-care/ADL training;Therapeutic exercise;Manual lymph drainage;Compression bandaging;Patient/family education;Other (comment);Therapist, nutritional;Therapeutic activities;DME and/or AE instruction;Manual Therapy skin care/ scar massage    Plan  fit with  BLE compression garments- ccl 2- Consider thigh highs vs knee length garments to facilitateimproved  lymphatic c circulation through the lymphatic bottleneck at medial knee and  proximally of knees towards functinoal inguinal LN    Clinical Decision Making  Several treatment options, min-mod task modification necessary    OT Home Exercise Plan  lymphatic pumping ther ex, simple self MLD, compression wraping and garments skin care,     Consulted and Agree with Plan of Care  Patient;Family member/caregiver       Patient will benefit from skilled therapeutic intervention in order to improve the following deficits and impairments:  Abnormal gait, Decreased knowledge of use of DME, Decreased skin integrity, Increased edema, Impaired flexibility, Pain, Decreased mobility, Impaired sensation, Decreased activity tolerance, Decreased range of motion, Decreased balance, Decreased knowledge of precautions, Difficulty walking  Visit Diagnosis: Lymphedema, not elsewhere classified - Plan: Ot plan of care cert/re-cert    Problem List Patient Active Problem List   Diagnosis Date Noted  . S/P total knee arthroplasty 07/26/2016  . Primary osteoarthritis of left knee 01/09/2016  . Left knee pain 10/13/2015  . Essential hypertension 11/08/2014  . Gastroesophageal reflux disease without esophagitis 11/08/2014  . Hypogonadism in male 11/08/2014  . Mild persistent asthma 11/08/2014  . Sleep disorder 11/08/2014  . Type 2 diabetes mellitus,  controlled ([place]) 11/08/2014    Ansel Bong 09/22/2017, 6:06 PM  [place] MAIN [facility] SERVICES [address] [place], [place], [address] Phone: [phone]   Fax:  [phone]  Name: CIEL YANES MRN: [id number] Date of Birth: 11-09-1950

## 2017-09-26 ENCOUNTER — Ambulatory Visit: Payer: Medicare Other | Attending: Orthopedic Surgery | Admitting: Occupational Therapy

## 2017-09-26 DIAGNOSIS — I890 Lymphedema, not elsewhere classified: Secondary | ICD-10-CM

## 2017-09-26 NOTE — Therapy (Signed)
[place] MAIN [facility] SERVICES [address] [place], [place], [address] Phone: [phone]   Fax:  [phone]  Occupational Therapy Treatment  Patient Details  Name: Benjamin Nicholson MRN: [id number] Date of Birth: [birth year] Referring Provider: Jeneen Rinks P. Marry Guan, MD   Encounter Date: 09/26/2017  OT End of Session - 09/26/17 0940    Visit Number  2    Number of Visits  36    Date for OT Re-Evaluation  12/20/17    OT Start Time  0804    OT Stop Time  0925    OT Time Calculation (min)  81 min    Activity Tolerance  Patient tolerated treatment well    Behavior During Therapy  [facility] for tasks assessed/performed       Past Medical History:  Diagnosis Date  . Asthma   . Diabetes mellitus without complication ([place])   . Diverticulosis   . Dyspnea   . Environmental and seasonal allergies   . GERD (gastroesophageal reflux disease)   . Hypertension   . Hypogonadism in male   . Osteoarthritis   . Prepatellar bursitis   . Sleep apnea     Past Surgical History:  Procedure Laterality Date  . ANKLE SURGERY Left   . COLONOSCOPY WITH PROPOFOL N/A 09/20/2014   Procedure: COLONOSCOPY WITH PROPOFOL;  Surgeon: Lollie Sails, MD;  Location: [facility] ENDOSCOPY;  Service: Endoscopy;  Laterality: N/A;  . EYE SURGERY    . KNEE ARTHROPLASTY Left 07/26/2016   Procedure: COMPUTER ASSISTED TOTAL KNEE ARTHROPLASTY;  Surgeon: Dereck Leep, MD;  Location: [facility] ORS;  Service: Orthopedics;  Laterality: Left;  . LEG SURGERY Left    Fracture    There were no vitals filed for this visit.  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 Pt is accompamnied by his spouse, [name], who, like Mr Selley , is also an OT. She is here to learn to assist Pt wil LE home program. Pt presents with thigh length comprtession wraps in place on RLE.     Patient is accompained by:  Family  member    Pertinent History  HTN, asthma, type II diabetes, ORIF L ankle , L TKA 2018, R TKA pending    Limitations  difficulty fitting street sjoes and lower body clothing, leg swelling and sensations of heaviness, fullness, and tightness increase w/ extended dependent positioning when driving, riding in car, sitting , standing, walking; difficulty reaching feet to don      LB clothing and shoes    Patient Stated Goals  reduce kleg swelling and keep it down, especially in my R leg in prep for knee surgery    Currently in Pain?  Yes leg pain unchanged- not rated numerically    Pain Location  Knee    Pain Orientation  Right    Pain Onset  1 to 4 weeks ago chronic    Pain Frequency  Intermittent    Aggravating Factors   weight bearing, resistance movement and exercise                   OT Treatments/Exercises (OP) - 09/26/17 0001      ADLs   ADL Education Given  Yes      Manual Therapy   Manual Therapy  Edema management;Compression Bandaging;Manual Lymphatic Drainage (MLD)    Manual Lymphatic Drainage (  MLD)  MLD to RLE/RLQ utilizing short neck sequence, deep abdominal pathways, functional iipsilateral inguinal LN, and thigh, bottleneck            and leg/ fott s Jstrokes from proximal to distal. Pt performed diaphragmatic breathing as instructed.    Compression Bandaging  gradfient compression wraps applied using short stretch wraps w/ low resting pressure. SS wraps w/ graduated widths applied from foot to groin over cotton stockinett laer first, then 0.4 cm thick Rosidal foam to groin.              OT Education - 09/26/17 0939    Education Details  Emphasis of Pt and CG edu for LE self care today on simple self MLD. Provided skilled instruction in entire RLE/RLQ sequence. Practice and handouts included.    Person(s) Educated  Patient;Spouse    Methods  Explanation;Demonstration;Handout;Tactile cues;Verbal cues    Comprehension  Verbalized understanding;Returned  demonstration;Need further instruction;Verbal cues required;Tactile cues required          OT Long Term Goals - 09/26/17 0952      OT LONG TERM GOAL #1   Title  Pt independent w/ lymphedema precautions/prevention principals and using printed reference to limit LE progression and infection risk.    Baseline  Max A  GOAL MET 09/26/2017    Time  2    Period  Weeks    Status  Achieved      OT LONG TERM GOAL #2   Title  Lymphedema (LE) management/ self-care: Pt able to apply thigh length gradient compression wraps with mod assist by spouse  between OT visits using correct gradient techniques within 2 visits to achieve optimal limb volume reduction during Intensive Phase of CDT and optimal self-management over time.    Baseline  max A ;MET 09/26/2017    Time  2    Period  Weeks    Status  Achieved      OT LONG TERM GOAL #3   Title  Lymphedema (LE) management/ self-care:  Pt to achieve at least 10%  limb volume reductions bilaterally during Intensive Phase CDT to limit LE progression, to decrease infection risk and to improve functional mobility and ambulation essential for optimal ADLs performance.    Baseline  max A    Time  12    Period  Weeks    Status  New      OT LONG TERM GOAL #4   Title  Lymphedema (LE) management/ self-care:  Pt to tolerate daily compression garments and/ or HOS devices in keeping w/ prescribed wear regime within 1 week of issue date to restore normal limb shape to reduce tissue density and protein build up leading to progression.    Baseline  max A    Time  12    Period  Weeks    Status  New      OT LONG TERM GOAL #5   Title  Lymphedema (LE) management/ self-care:  During Management Phase CDT Pt to sustain reduced limb volumes achieved during Intensive Phase CDT at follow up visits during Management Phase CDT within 3% using LE self-care home program components as directed ( simple self MLD, skin care, ther ex and daily/ nightly compression garments/ devices) .     Baseline  max A    Time  6    Period  Months    Status  New            Plan - 09/26/17 2094  Clinical Impression Statement  Mr. Corrigan tolerated full RLE compression wraps over the weekend withuot difficulty. He was able to doff and don compression wraps using proper gradient technique with Max A from his wife  to inspect skin, bathe and rewrap daily as instructed. RLE is nearly fully decongested, with mild swelling remaining circumferentialy around knee. Mild petechia noted at mid shin in diagonal pattern, most likely due to bunching of compression wraps. Provided Pt and spouse edu throught session for anatomy of MLD, indications and contraindications, techniques and sequences. By end of session spouse was able to perform basic J stroke and short neck sequence with mod A.  Pt able to perform diaphramatic breathing using correct technique to activate deep abdominal and pelvic LN.  Reapplied kinesio tape to assist w/ fluid decongestion, and reapplied groin length gradient compression wraps. Cont as perr POC. RLE demonstrates excellent response to treatment.    Occupational performance deficits (Please refer to evaluation for details):  ADL's;IADL's;Work;Leisure;Social Participation    Rehab Potential  Excellent    OT Frequency  1x / week    OT Duration  Other (comment) and PRN to assist w/ swelling reduction  p TKA and garment replacements    OT Treatment/Interventions  Self-care/ADL training;Therapeutic exercise;Manual lymph drainage;Compression bandaging;Patient/family education;Other (comment);Therapist, nutritional;Therapeutic activities;DME and/or AE instruction;Manual Therapy skin care/ scar massage    Plan  fit with BLE compression garments- ccl 2- Consider thigh highs vs knee length garments to facilitateimproved  lymphatic c circulation through the lymphatic bottleneck at medial knee and  proximally of knees towards functinoal inguinal LN    Clinical Decision Making  Several  treatment options, min-mod task modification necessary    OT Home Exercise Plan  lymphatic pumping ther ex, simple self MLD, compression wraping and garments skin care,     Consulted and Agree with Plan of Care  Patient;Family member/caregiver       Patient will benefit from skilled therapeutic intervention in order to improve the following deficits and impairments:  Abnormal gait, Decreased knowledge of use of DME, Decreased skin integrity, Increased edema, Impaired flexibility, Pain, Decreased mobility, Impaired sensation, Decreased activity tolerance, Decreased range of motion, Decreased balance, Decreased knowledge of precautions, Difficulty walking  Visit Diagnosis: Lymphedema, not elsewhere classified    Problem List Patient Active Problem List   Diagnosis Date Noted  . S/P total knee arthroplasty 07/26/2016  . Primary osteoarthritis of left knee 01/09/2016  . Left knee pain 10/13/2015  . Essential hypertension 11/08/2014  . Gastroesophageal reflux disease without esophagitis 11/08/2014  . Hypogonadism in male 11/08/2014  . Mild persistent asthma 11/08/2014  . Sleep disorder 11/08/2014  . Type 2 diabetes mellitus, controlled ([place]) 11/08/2014    Andrey Spearman, MS, OTR/L, [facility] 09/26/17 9:54 AM  [place] MAIN [facility] SERVICES [address] [place], [place], [address] Phone: [phone]   Fax:  [phone]  Name: CHARAN PRIETO MRN: [id number] Date of Birth: 08-12-1950

## 2017-10-04 ENCOUNTER — Ambulatory Visit: Payer: Medicare Other | Admitting: Occupational Therapy

## 2017-10-04 DIAGNOSIS — I890 Lymphedema, not elsewhere classified: Secondary | ICD-10-CM

## 2017-10-04 NOTE — Therapy (Signed)
[place] MAIN [facility] SERVICES [address] [place], [place], [address] Phone: [phone]   Fax:  [phone]  Occupational Therapy Treatment  Patient Details  Name: Benjamin Nicholson MRN: [id number] Date of Birth: Aug 08, 1950 Referring Provider: Jeneen Rinks P. Marry Guan, MD   Encounter Date: 10/04/2017  OT End of Session - 10/04/17 1308    Visit Number  3    Number of Visits  36    Date for OT Re-Evaluation  12/20/17    OT Start Time  0905    OT Stop Time  1035    OT Time Calculation (min)  90 min    Activity Tolerance  Patient tolerated treatment well    Behavior During Therapy  [facility] for tasks assessed/performed       Past Medical History:  Diagnosis Date  . Asthma   . Diabetes mellitus without complication ([place])   . Diverticulosis   . Dyspnea   . Environmental and seasonal allergies   . GERD (gastroesophageal reflux disease)   . Hypertension   . Hypogonadism in male   . Osteoarthritis   . Prepatellar bursitis   . Sleep apnea     Past Surgical History:  Procedure Laterality Date  . ANKLE SURGERY Left   . COLONOSCOPY WITH PROPOFOL N/A 09/20/2014   Procedure: COLONOSCOPY WITH PROPOFOL;  Surgeon: Lollie Sails, MD;  Location: [facility] ENDOSCOPY;  Service: Endoscopy;  Laterality: N/A;  . EYE SURGERY    . KNEE ARTHROPLASTY Left 07/26/2016   Procedure: COMPUTER ASSISTED TOTAL KNEE ARTHROPLASTY;  Surgeon: Dereck Leep, MD;  Location: [facility] ORS;  Service: Orthopedics;  Laterality: Left;  . LEG SURGERY Left    Fracture    There were no vitals filed for this visit.  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 Pt is accompamnied by his spouse, Benjamin Nicholson,. Pt presents with B knee length wraps in place. Pt expresses concern re leg swelling  as R knee sx is scheduled . OTreassured Pt that leg swelling will be well controlled before and  after sx with tools and strategies acquired in OT, and we can assist PRN.explained the difference     Patient is accompained by:  Family member    Pertinent History  HTN, asthma, type II diabetes, ORIF L ankle , L TKA 2018, R TKA pending    Limitations  difficulty fitting street sjoes and lower body clothing, leg swelling and sensations of heaviness, fullness, and tightness increase w/ extended dependent positioning when driving, riding in car, sitting , standing, walking; difficulty reaching feet to don      LB clothing and shoes    Patient Stated Goals  reduce kleg swelling and keep it down, especially in my R leg in prep for knee surgery    Currently in Pain?  Yes    Pain Location  Leg    Pain Orientation  Left    Pain Descriptors / Indicators  Heaviness;Tightness;Discomfort;Other (Comment) fullness    Pain Type  Acute pain    Pain Onset  1 to 4 weeks ago chronic    Pain Relieving Factors  Pt started Lasiz last week                           OT Education - 10/04/17 1106    Education  Details  Pt and spouse edu for csimple self MLD and ompression garment properties and recommendations. Educated on individualized specifications for optimal swelling management before and after upcoming TKA    Person(s) Educated  Patient;Spouse    Methods  Explanation;Demonstration;Handout;Tactile cues;Verbal cues    Comprehension  Verbalized understanding;Returned demonstration;Need further instruction;Verbal cues required;Tactile cues required          OT Long Term Goals - 09/26/17 0952      OT LONG TERM GOAL #1   Title  Pt independent w/ lymphedema precautions/prevention principals and using printed reference to limit LE progression and infection risk.    Baseline  Max A  GOAL MET 09/26/2017    Time  2    Period  Weeks    Status  Achieved      OT LONG TERM GOAL #2   Title  Lymphedema (LE) management/ self-care: Pt able to apply thigh length gradient compression wraps with mod  assist by spouse  between OT visits using correct gradient techniques within 2 visits to achieve optimal limb volume reduction during Intensive Phase of CDT and optimal self-management over time.    Baseline  max A ;MET 09/26/2017    Time  2    Period  Weeks    Status  Achieved      OT LONG TERM GOAL #3   Title  Lymphedema (LE) management/ self-care:  Pt to achieve at least 10%  limb volume reductions bilaterally during Intensive Phase CDT to limit LE progression, to decrease infection risk and to improve functional mobility and ambulation essential for optimal ADLs performance.    Baseline  max A    Time  12    Period  Weeks    Status  New      OT LONG TERM GOAL #4   Title  Lymphedema (LE) management/ self-care:  Pt to tolerate daily compression garments and/ or HOS devices in keeping w/ prescribed wear regime within 1 week of issue date to restore normal limb shape to reduce tissue density and protein build up leading to progression.    Baseline  max A    Time  12    Period  Weeks    Status  New      OT LONG TERM GOAL #5   Title  Lymphedema (LE) management/ self-care:  During Management Phase CDT Pt to sustain reduced limb volumes achieved during Intensive Phase CDT at follow up visits during Management Phase CDT within 3% using LE self-care home program components as directed ( simple self MLD, skin care, ther ex and daily/ nightly compression garments/ devices) .    Baseline  max A    Time  6    Period  Months    Status  New            Plan - 10/04/17 1309    Clinical Impression Statement  Pt and spouse edu for recommended compression garments and clinical reasoning for specifications. In preparation for R TKA  Completed anatomical measurements for daytime compression garments.Pt will benefit from thigh length gradient compression stockings- ccl 2 (30-40  mmHg). After surgery Pt may be able to move to knee length garments instead. Assisted Pt and spouse to order garments during  session:n Juzo Dynamic (Varin 3212) ccl 2 (30-40) AG w/ open toe and silicone top band- sz 4, REG. Provided MLD to RLE and RLQ as established. Provided Pt and spouse training for MLD as well. Reviewed handouts again. Applied compression wraps to  RLE as established. Pt will return next week     for garment assessment and fitting. We may modify POC frequency and duration PRN. since spouse if assisting between visits.    Occupational performance deficits (Please refer to evaluation for details):  ADL's;IADL's;Work;Leisure;Social Participation    Rehab Potential  Excellent    OT Frequency  1x / week    OT Duration  Other (comment) and PRN to assist w/ swelling reduction  p TKA and garment replacements    OT Treatment/Interventions  Self-care/ADL training;Therapeutic exercise;Manual lymph drainage;Compression bandaging;Patient/family education;Other (comment);Therapist, nutritional;Therapeutic activities;DME and/or AE instruction;Manual Therapy skin care/ scar massage    Plan  fit with BLE compression garments- ccl 2- Consider thigh highs vs knee length garments to facilitateimproved  lymphatic c circulation through the lymphatic bottleneck at medial knee and  proximally of knees towards functinoal inguinal LN    Clinical Decision Making  Several treatment options, min-mod task modification necessary    OT Home Exercise Plan  lymphatic pumping ther ex, simple self MLD, compression wraping and garments skin care,     Consulted and Agree with Plan of Care  Patient;Family member/caregiver       Patient will benefit from skilled therapeutic intervention in order to improve the following deficits and impairments:  Abnormal gait, Decreased knowledge of use of DME, Decreased skin integrity, Increased edema, Impaired flexibility, Pain, Decreased mobility, Impaired sensation, Decreased activity tolerance, Decreased range of motion, Decreased balance, Decreased knowledge of precautions, Difficulty  walking  Visit Diagnosis: Lymphedema, not elsewhere classified    Problem List Patient Active Problem List   Diagnosis Date Noted  . S/P total knee arthroplasty 07/26/2016  . Primary osteoarthritis of left knee 01/09/2016  . Left knee pain 10/13/2015  . Essential hypertension 11/08/2014  . Gastroesophageal reflux disease without esophagitis 11/08/2014  . Hypogonadism in male 11/08/2014  . Mild persistent asthma 11/08/2014  . Sleep disorder 11/08/2014  . Type 2 diabetes mellitus, controlled ([place]) 11/08/2014    Andrey Spearman, MS, OTR/L, [facility] 10/04/17 1:15 PM  [place] MAIN [facility] SERVICES [address] [place], [place], [address] Phone: [phone]   Fax:  [phone]  Name: KASTIN CERDA MRN: [id number] Date of Birth: [birth year]

## 2017-10-10 ENCOUNTER — Ambulatory Visit: Payer: Medicare Other | Admitting: Occupational Therapy

## 2017-10-10 DIAGNOSIS — I890 Lymphedema, not elsewhere classified: Secondary | ICD-10-CM | POA: Diagnosis not present

## 2017-10-10 NOTE — Therapy (Signed)
[place] MAIN [facility] SERVICES [address] [place], [place], [address] Phone: [phone]   Fax:  [phone]  Occupational Therapy Treatment  Patient Details  Name: Benjamin Nicholson MRN: [id number] Date of Birth: June 14, 1950 Referring Provider: Jeneen Rinks P. Marry Guan, MD   Encounter Date: 10/10/2017  OT End of Session - 10/10/17 1602    Visit Number  4    Number of Visits  36    Date for OT Re-Evaluation  12/20/17    OT Start Time  0905    OT Stop Time  1002    OT Time Calculation (min)  57 min    Activity Tolerance  Patient tolerated treatment well    Behavior During Therapy  [facility] for tasks assessed/performed       Past Medical History:  Diagnosis Date  . Asthma   . Diabetes mellitus without complication ([place])   . Diverticulosis   . Dyspnea   . Environmental and seasonal allergies   . GERD (gastroesophageal reflux disease)   . Hypertension   . Hypogonadism in male   . Osteoarthritis   . Prepatellar bursitis   . Sleep apnea     Past Surgical History:  Procedure Laterality Date  . ANKLE SURGERY Left   . COLONOSCOPY WITH PROPOFOL N/A 09/20/2014   Procedure: COLONOSCOPY WITH PROPOFOL;  Surgeon: Lollie Sails, MD;  Location: [facility] ENDOSCOPY;  Service: Endoscopy;  Laterality: N/A;  . EYE SURGERY    . KNEE ARTHROPLASTY Left 07/26/2016   Procedure: COMPUTER ASSISTED TOTAL KNEE ARTHROPLASTY;  Surgeon: Dereck Leep, MD;  Location: [facility] ORS;  Service: Orthopedics;  Laterality: Left;  . LEG SURGERY Left    Fracture    There were no vitals filed for this visit.  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 Pt is unaccompamnied today. Pt presents wearing recommended Juzo, ccl 2 open toe , thigh high compression garments. Pt is pleased with ability to manage lymphedema with garments  vs wraps at present.      Patient is  accompained by:  Family member    Pertinent History  HTN, asthma, type II diabetes, ORIF L ankle , L TKA 2018, R TKA pending    Limitations  difficulty fitting street sjoes and lower body clothing, leg swelling and sensations of heaviness, fullness, and tightness increase w/ extended dependent positioning when driving, riding in car, sitting , standing, walking; difficulty reaching feet to don      LB clothing and shoes    Patient Stated Goals  reduce kleg swelling and keep it down, especially in my R leg in prep for knee surgery    Currently in Pain?  No/denies    Pain Onset  1 to 4 weeks ago chronic                   OT Treatments/Exercises (OP) - 10/10/17 0001      ADLs   ADL Education Given  Yes      Manual Therapy   Manual Therapy  Edema management;Manual Lymphatic Drainage (MLD)    Manual therapy comments  compression garment assessment and fitting    Edema Management  skin care w/ castor oil to LLE during MLD to improve hydration and flexibility    Manual Lymphatic Drainage (MLD)  MLD to RLE/RLQ utilizing short neck sequence, deep abdominal  pathways, functional iipsilateral inguinal LN, and thigh, bottleneck            and leg/ fott s Jstrokes from proximal to distal. Pt performed diaphragmatic breathing as instructed.             OT Education - 10/10/17 1601    Education Details  Pt edu for using Juso garment adhesive to keep top edge of stockings from falling down causing garments to bunch at the knee. Provided internet resource.    Person(s) Educated  Patient;Spouse    Methods  Explanation;Demonstration;Handout;Tactile cues;Verbal cues    Comprehension  Verbalized understanding;Returned demonstration;Need further instruction;Verbal cues required;Tactile cues required          OT Long Term Goals - 09/26/17 0952      OT LONG TERM GOAL #1   Title  Pt independent w/ lymphedema precautions/prevention principals and using printed reference to limit LE  progression and infection risk.    Baseline  Max A  GOAL MET 09/26/2017    Time  2    Period  Weeks    Status  Achieved      OT LONG TERM GOAL #2   Title  Lymphedema (LE) management/ self-care: Pt able to apply thigh length gradient compression wraps with mod assist by spouse  between OT visits using correct gradient techniques within 2 visits to achieve optimal limb volume reduction during Intensive Phase of CDT and optimal self-management over time.    Baseline  max A ;MET 09/26/2017    Time  2    Period  Weeks    Status  Achieved      OT LONG TERM GOAL #3   Title  Lymphedema (LE) management/ self-care:  Pt to achieve at least 10%  limb volume reductions bilaterally during Intensive Phase CDT to limit LE progression, to decrease infection risk and to improve functional mobility and ambulation essential for optimal ADLs performance.    Baseline  max A    Time  12    Period  Weeks    Status  New      OT LONG TERM GOAL #4   Title  Lymphedema (LE) management/ self-care:  Pt to tolerate daily compression garments and/ or HOS devices in keeping w/ prescribed wear regime within 1 week of issue date to restore normal limb shape to reduce tissue density and protein build up leading to progression.    Baseline  max A    Time  12    Period  Weeks    Status  New      OT LONG TERM GOAL #5   Title  Lymphedema (LE) management/ self-care:  During Management Phase CDT Pt to sustain reduced limb volumes achieved during Intensive Phase CDT at follow up visits during Management Phase CDT within 3% using LE self-care home program components as directed ( simple self MLD, skin care, ther ex and daily/ nightly compression garments/ devices) .    Baseline  max A    Time  6    Period  Months    Status  New            Plan - 10/10/17 1602    Clinical Impression Statement  Recommended comnpression garments apear to fit well and properly contain swelling in BLE from feet to groin. Pt reports garments are  comfortable and he is able to don and doff them using assistive devices demonstrated in previous session. Garmnts do not have recommended silicone band at top edge and  they appear to slide down a bit over time. Demonstrated Teacher, adult education and provided [employer]. Pt tolerated MLD and kin care,, compression therapy and edu. Cont 1 x weekly to further soften dermal fibrosis and promote optimal lymphic function in prep for LLE TKA.    Occupational performance deficits (Please refer to evaluation for details):  ADL's;IADL's;Work;Leisure;Social Participation    Rehab Potential  Excellent    OT Frequency  1x / week    OT Duration  Other (comment) and PRN to assist w/ swelling reduction  p TKA and garment replacements    OT Treatment/Interventions  Self-care/ADL training;Therapeutic exercise;Manual lymph drainage;Compression bandaging;Patient/family education;Other (comment);Therapist, nutritional;Therapeutic activities;DME and/or AE instruction;Manual Therapy skin care/ scar massage    Plan  fit with BLE compression garments- ccl 2- Consider thigh highs vs knee length garments to facilitateimproved  lymphatic c circulation through the lymphatic bottleneck at medial knee and  proximally of knees towards functinoal inguinal LN    Clinical Decision Making  Several treatment options, min-mod task modification necessary    OT Home Exercise Plan  lymphatic pumping ther ex, simple self MLD, compression wraping and garments skin care,     Consulted and Agree with Plan of Care  Patient;Family member/caregiver       Patient will benefit from skilled therapeutic intervention in order to improve the following deficits and impairments:  Abnormal gait, Decreased knowledge of use of DME, Decreased skin integrity, Increased edema, Impaired flexibility, Pain, Decreased mobility, Impaired sensation, Decreased activity tolerance, Decreased range of motion, Decreased balance, Decreased knowledge of  precautions, Difficulty walking  Visit Diagnosis: Lymphedema, not elsewhere classified    Problem List Patient Active Problem List   Diagnosis Date Noted  . S/P total knee arthroplasty 07/26/2016  . Primary osteoarthritis of left knee 01/09/2016  . Left knee pain 10/13/2015  . Essential hypertension 11/08/2014  . Gastroesophageal reflux disease without esophagitis 11/08/2014  . Hypogonadism in male 11/08/2014  . Mild persistent asthma 11/08/2014  . Sleep disorder 11/08/2014  . Type 2 diabetes mellitus, controlled ([place]) 11/08/2014    Andrey Spearman, MS, OTR/L, [facility] 10/10/17 4:07 PM   [place] MAIN [facility] SERVICES [address] [place], [place], [address] Phone: [phone]   Fax:  [phone]  Name: Benjamin Nicholson MRN: [id number] Date of Birth: 08-20-1950

## 2017-10-17 ENCOUNTER — Ambulatory Visit: Payer: Medicare Other | Admitting: Occupational Therapy

## 2017-10-17 DIAGNOSIS — I890 Lymphedema, not elsewhere classified: Secondary | ICD-10-CM | POA: Diagnosis not present

## 2017-10-17 NOTE — Therapy (Signed)
[place] MAIN [facility] SERVICES [address] [place], [place], [address] Phone: [phone]   Fax:  [phone]  Occupational Therapy Treatment  Patient Details  Name: Benjamin Nicholson MRN: [id number] Date of Birth: Mar 11, 1951 Referring Provider: Jeneen Rinks P. Hooten, MD   Encounter Date: 10/17/2017  OT End of Session - 10/17/17 1056    Visit Number  5    Number of Visits  36    Date for OT Re-Evaluation  12/20/17    OT Start Time  0900    OT Stop Time  0100    OT Time Calculation (min)  960 min    Activity Tolerance  Patient tolerated treatment well    Behavior During Therapy  [facility] for tasks assessed/performed       Past Medical History:  Diagnosis Date  . Asthma   . Diabetes mellitus without complication ([place])   . Diverticulosis   . Dyspnea   . Environmental and seasonal allergies   . GERD (gastroesophageal reflux disease)   . Hypertension   . Hypogonadism in male   . Osteoarthritis   . Prepatellar bursitis   . Sleep apnea     Past Surgical History:  Procedure Laterality Date  . ANKLE SURGERY Left   . COLONOSCOPY WITH PROPOFOL N/A 09/20/2014   Procedure: COLONOSCOPY WITH PROPOFOL;  Surgeon: Lollie Sails, MD;  Location: [facility] ENDOSCOPY;  Service: Endoscopy;  Laterality: N/A;  . EYE SURGERY    . KNEE ARTHROPLASTY Left 07/26/2016   Procedure: COMPUTER ASSISTED TOTAL KNEE ARTHROPLASTY;  Surgeon: Dereck Leep, MD;  Location: [facility] ORS;  Service: Orthopedics;  Laterality: Left;  . LEG SURGERY Left    Fracture    There were no vitals filed for this visit.  Subjective Assessment - 10/17/17 1043    Subjective   Pt presents for OT visit 5/36 to address mild, stage I, BLE LE 2/2 suspected CVI and orthopedic trauma to anklesand knees. Pt presents wearing recommended Juzo, ccl 2 open toe , thigh high compression garments, whick are bunching somewhat at the knees.    Patient is accompained by:  Family member    Pertinent History  HTN, asthma,  type II diabetes, ORIF L ankle , L TKA 2018, R TKA pending    Limitations  difficulty fitting street sjoes and lower body clothing, leg swelling and sensations of heaviness, fullness, and tightness increase w/ extended dependent positioning when driving, riding in car, sitting , standing, walking; difficulty reaching feet to don      LB clothing and shoes    Patient Stated Goals  reduce kleg swelling and keep it down, especially in my R leg in prep for knee surgery    Currently in Pain?  No/denies    Pain Onset  1 to 4 weeks ago chronic                           OT Education - 10/17/17 1045    Education Details  Pt and spouse educated reguarding correct donning and doffing of compression garments to achieve correct gradient compression distribution to limit painful bunching at the knees ( tourniquet effect)  and worsening distal swelling. By end of edu, including verbal and tactile cues and demonstration, Pt and spouse able to distribute garmnent fabric correctly for optimnal gradient compression    Person(s) Educated  Patient;Spouse    Methods  Explanation;Demonstration;Handout;Tactile cues;Verbal cues    Comprehension  Verbalized understanding;Returned demonstration;Need  further instruction;Verbal cues required;Tactile cues required          OT Long Term Goals - 10/17/17 1008      OT LONG TERM GOAL #1   Title  Pt independent w/ lymphedema precautions/prevention principals and using printed reference to limit LE progression and infection risk.  (Pended)     Baseline  Max A  GOAL MET 09/26/2017  (Pended)     Time  2  (Pended)     Period  Weeks  (Pended)     Status  Achieved  (Pended)       OT LONG TERM GOAL #2   Title  Lymphedema (LE) management/ self-care: Pt able to apply thigh length gradient compression wraps with mod assist by spouse  between OT visits using correct gradient techniques within 2 visits to achieve optimal limb volume reduction during Intensive Phase  of CDT and optimal self-management over time.  (Pended)     Baseline  max A ;MET 09/26/2017  (Pended)     Time  2  (Pended)     Period  Weeks  (Pended)     Status  Achieved  (Pended)       OT LONG TERM GOAL #3   Title  Lymphedema (LE) management/ self-care:  Pt to achieve at least 10%  limb volume reductions bilaterally during Intensive Phase CDT to limit LE progression, to decrease infection risk and to improve functional mobility and ambulation essential for optimal ADLs performance.  (Pended)     Baseline  max A  (Pended)     Time  12  (Pended)     Period  Weeks  (Pended)     Status  Achieved  (Pended)       OT LONG TERM GOAL #4   Title  Lymphedema (LE) management/ self-care:  Pt to tolerate daily compression garments and/ or HOS devices in keeping w/ prescribed wear regime within 1 week of issue date to restore normal limb shape to reduce tissue density and protein build up leading to progression.  (Pended)     Baseline  max A  (Pended)     Time  12  (Pended)     Period  Weeks  (Pended)     Status  Achieved  (Pended)       OT LONG TERM GOAL #5   Title  Lymphedema (LE) management/ self-care:  During Management Phase CDT Pt to sustain reduced limb volumes achieved during Intensive Phase CDT at follow up visits during Management Phase CDT within 3% using LE self-care home program components as directed ( simple self MLD, skin care, ther ex and daily/ nightly compression garments/ devices) .  (Pended)     Baseline  max A  (Pended)     Time  6  (Pended)     Period  Months  (Pended)     Status  On-going  (Pended)             Plan - 10/17/17 1057    Clinical Impression Statement  Provided Pt edu for proper compression garment donning techniques to ensure optimal fabric distibution to limit painful bunching at knee and tourniquet effect. Provided LLE MLD as established today. Pt able to perform neck sequence after review. Reviewed progress towards all goals with Pt and his wife and  discussed continuing at current 1 x weekly OT frequency vs reducing frequency. Pt and spouse would like to continue at current frequency  as they are not performing MLD at home due  to Pt being unable to reach his feet.  Will  continue to educate for simple self MLD with alternative positioning.    Occupational performance deficits (Please refer to evaluation for details):  ADL's;IADL's;Work;Leisure;Social Participation    Rehab Potential  Excellent    OT Frequency  1x / week    OT Duration  Other (comment) and PRN to assist w/ swelling reduction  p TKA and garment replacements    OT Treatment/Interventions  Self-care/ADL training;Therapeutic exercise;Manual lymph drainage;Compression bandaging;Patient/family education;Other (comment);Therapist, nutritional;Therapeutic activities;DME and/or AE instruction;Manual Therapy skin care/ scar massage    Plan  fit with BLE compression garments- ccl 2- Consider thigh highs vs knee length garments to facilitateimproved  lymphatic c circulation through the lymphatic bottleneck at medial knee and  proximally of knees towards functinoal inguinal LN    Clinical Decision Making  Several treatment options, min-mod task modification necessary    OT Home Exercise Plan  lymphatic pumping ther ex, simple self MLD, compression wraping and garments skin care,     Consulted and Agree with Plan of Care  Patient;Family member/caregiver       Patient will benefit from skilled therapeutic intervention in order to improve the following deficits and impairments:  Abnormal gait, Decreased knowledge of use of DME, Decreased skin integrity, Increased edema, Impaired flexibility, Pain, Decreased mobility, Impaired sensation, Decreased activity tolerance, Decreased range of motion, Decreased balance, Decreased knowledge of precautions, Difficulty walking  Visit Diagnosis: Lymphedema, not elsewhere classified    Problem List Patient Active Problem List   Diagnosis Date  Noted  . S/P total knee arthroplasty 07/26/2016  . Primary osteoarthritis of left knee 01/09/2016  . Left knee pain 10/13/2015  . Essential hypertension 11/08/2014  . Gastroesophageal reflux disease without esophagitis 11/08/2014  . Hypogonadism in male 11/08/2014  . Mild persistent asthma 11/08/2014  . Sleep disorder 11/08/2014  . Type 2 diabetes mellitus, controlled ([place]) 11/08/2014    Andrey Spearman, MS, OTR/L, [facility] 10/17/17 11:01 AM   [place] MAIN [facility] SERVICES [address] [place], [place], [address] Phone: [phone]   Fax:  [phone]  Name: Benjamin Nicholson MRN: [id number] Date of Birth: [birth year]

## 2017-10-26 ENCOUNTER — Ambulatory Visit: Payer: Medicare Other | Admitting: Occupational Therapy

## 2017-10-26 DIAGNOSIS — I890 Lymphedema, not elsewhere classified: Secondary | ICD-10-CM | POA: Diagnosis not present

## 2017-10-26 NOTE — Therapy (Signed)
[place] MAIN [facility] SERVICES [address] [place], [place], [address] Phone: [phone]   Fax:  [phone]  Occupational Therapy Treatment  Patient Details  Name: Benjamin Nicholson MRN: [id number] Date of Birth: [birth year] Referring Provider: Jeneen Rinks P. Marry Guan, MD   Encounter Date: 10/26/2017  OT End of Session - 10/26/17 1017    Visit Number  6    Number of Visits  36    Date for OT Re-Evaluation  12/20/17    OT Start Time  0904    OT Stop Time  1004    OT Time Calculation (min)  60 min    Equipment Utilized During Treatment  1004    Activity Tolerance  Patient tolerated treatment well;No increased pain    Behavior During Therapy  WFL for tasks assessed/performed       Past Medical History:  Diagnosis Date  . Asthma   . Diabetes mellitus without complication ([place])   . Diverticulosis   . Dyspnea   . Environmental and seasonal allergies   . GERD (gastroesophageal reflux disease)   . Hypertension   . Hypogonadism in male   . Osteoarthritis   . Prepatellar bursitis   . Sleep apnea     Past Surgical History:  Procedure Laterality Date  . ANKLE SURGERY Left   . COLONOSCOPY WITH PROPOFOL N/A 09/20/2014   Procedure: COLONOSCOPY WITH PROPOFOL;  Surgeon: Lollie Sails, MD;  Location: [facility] ENDOSCOPY;  Service: Endoscopy;  Laterality: N/A;  . EYE SURGERY    . KNEE ARTHROPLASTY Left 07/26/2016   Procedure: COMPUTER ASSISTED TOTAL KNEE ARTHROPLASTY;  Surgeon: Dereck Leep, MD;  Location: [facility] ORS;  Service: Orthopedics;  Laterality: Left;  . LEG SURGERY Left    Fracture    There were no vitals filed for this visit.  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 Pt presents wearing recommended Juzo, ccl 2 open toe , thigh high compression garments bilaterally. Pt is pleased with progress thus far.    Patient is accompained by:  Family member    Pertinent History  HTN, asthma, type II diabetes, ORIF L ankle , L TKA 2018, R TKA pending    Limitations  difficulty fitting street sjoes and lower body clothing, leg swelling and sensations of heaviness, fullness, and tightness increase w/ extended dependent positioning when driving, riding in car, sitting , standing, walking; difficulty reaching feet to don      LB clothing and shoes    Patient Stated Goals  reduce kleg swelling and keep it down, especially in my R leg in prep for knee surgery    Currently in Pain?  Yes Chronic pain unchanged. Not numerically rated    Pain Onset  1 to 4 weeks ago chronic                   OT Treatments/Exercises (OP) - 10/26/17 0001      ADLs   ADL Education Given  Yes      Manual Therapy   Manual Therapy  Edema management;Manual Lymphatic Drainage (MLD)    Edema Management  skin care w/ castor oil to RLE during MLD to improve hydration and flexibility    Manual Lymphatic Drainage (MLD)  MLD to RLE/RLQ utilizing short neck sequence, deep abdominal pathways, functional iipsilateral inguinal LN, and thigh, bottleneck  and leg/ fott s Jstrokes from proximal to distal. Pt performed diaphragmatic breathing as instructed.    Compression Bandaging  Max A to apply RLE fthigh length compression  stockinggradfient compression wraps applied using short stretch wraps w/ low resting pressure. SS wraps w/ graduated widths applied from foot to groin over cotton stockinett laer first, then 0.4 cm thick Rosidal foam to groin.              OT Education - 10/26/17 [phone]    Education Details  Continued skilled Pt/caregiver education  And LE ADL training throughout visit for lymphedema self care/ home program, including compression wrapping, compression garment and device wear/care, lymphatic pumping ther ex, simple self-MLD, and skin care. Discussed progress towards goals.     Person(s) Educated  Patient     Methods  Explanation;Demonstration    Comprehension  Verbalized understanding;Returned demonstration          OT Long Term Goals - 10/17/17 1008      OT LONG TERM GOAL #1   Title  Pt independent w/ lymphedema precautions/prevention principals and using printed reference to limit LE progression and infection risk.  (Pended)     Baseline  Max A  GOAL MET 09/26/2017  (Pended)     Time  2  (Pended)     Period  Weeks  (Pended)     Status  Achieved  (Pended)       OT LONG TERM GOAL #2   Title  Lymphedema (LE) management/ self-care: Pt able to apply thigh length gradient compression wraps with mod assist by spouse  between OT visits using correct gradient techniques within 2 visits to achieve optimal limb volume reduction during Intensive Phase of CDT and optimal self-management over time.  (Pended)     Baseline  max A ;MET 09/26/2017  (Pended)     Time  2  (Pended)     Period  Weeks  (Pended)     Status  Achieved  (Pended)       OT LONG TERM GOAL #3   Title  Lymphedema (LE) management/ self-care:  Pt to achieve at least 10%  limb volume reductions bilaterally during Intensive Phase CDT to limit LE progression, to decrease infection risk and to improve functional mobility and ambulation essential for optimal ADLs performance.  (Pended)     Baseline  max A  (Pended)     Time  12  (Pended)     Period  Weeks  (Pended)     Status  Achieved  (Pended)       OT LONG TERM GOAL #4   Title  Lymphedema (LE) management/ self-care:  Pt to tolerate daily compression garments and/ or HOS devices in keeping w/ prescribed wear regime within 1 week of issue date to restore normal limb shape to reduce tissue density and protein build up leading to progression.  (Pended)     Baseline  max A  (Pended)     Time  12  (Pended)     Period  Weeks  (Pended)     Status  Achieved  (Pended)       OT LONG TERM GOAL #5   Title  Lymphedema (LE) management/ self-care:  During Management Phase CDT Pt to sustain reduced  limb volumes achieved during Intensive Phase CDT at follow up visits during Management Phase CDT within 3% using LE self-care home program components as directed ( simple self MLD, skin care, ther ex and daily/ nightly compression garments/ devices) .  (  Pended)     Baseline  max A  (Pended)     Time  6  (Pended)     Period  Months  (Pended)     Status  On-going  (Pended)             Plan - 10/26/17 1018    Clinical Impression Statement  Provided RLE MLD in supine w/ emphasis to inguinal LN, thigh and bottleneck at medial R knee. Provided skin cae throughout manual therapy to increase tissue mobility, decrease fibrosis and improve hydration. Provided Pt edu re LE self-care throughout session. Provided Max A to doff and don RLE thigh high garment. Cont 1 x weekly as per POC.    Occupational performance deficits (Please refer to evaluation for details):  ADL's;IADL's;Work;Leisure;Social Participation    Rehab Potential  Excellent    OT Frequency  1x / week    OT Duration  Other (comment) and PRN to assist w/ swelling reduction  p TKA and garment replacements    OT Treatment/Interventions  Self-care/ADL training;Therapeutic exercise;Manual lymph drainage;Compression bandaging;Patient/family education;Other (comment);Therapist, nutritional;Therapeutic activities;DME and/or AE instruction;Manual Therapy skin care/ scar massage    Plan  fit with BLE compression garments- ccl 2- Consider thigh highs vs knee length garments to facilitateimproved  lymphatic c circulation through the lymphatic bottleneck at medial knee and  proximally of knees towards functinoal inguinal LN    Clinical Decision Making  Several treatment options, min-mod task modification necessary    OT Home Exercise Plan  lymphatic pumping ther ex, simple self MLD, compression wraping and garments skin care,     Consulted and Agree with Plan of Care  Patient;Family member/caregiver       Patient will benefit from skilled  therapeutic intervention in order to improve the following deficits and impairments:  Abnormal gait, Decreased knowledge of use of DME, Decreased skin integrity, Increased edema, Impaired flexibility, Pain, Decreased mobility, Impaired sensation, Decreased activity tolerance, Decreased range of motion, Decreased balance, Decreased knowledge of precautions, Difficulty walking  Visit Diagnosis: Lymphedema, not elsewhere classified    Problem List Patient Active Problem List   Diagnosis Date Noted  . S/P total knee arthroplasty 07/26/2016  . Primary osteoarthritis of left knee 01/09/2016  . Left knee pain 10/13/2015  . Essential hypertension 11/08/2014  . Gastroesophageal reflux disease without esophagitis 11/08/2014  . Hypogonadism in male 11/08/2014  . Mild persistent asthma 11/08/2014  . Sleep disorder 11/08/2014  . Type 2 diabetes mellitus, controlled ([place]) 11/08/2014    Andrey Spearman, MS, OTR/L, [facility] 10/26/17 10:20 AM   [place] MAIN [facility] SERVICES [address] [place], [place], [address] Phone: [phone]   Fax:  [phone]  Name: Benjamin Nicholson MRN: [id number] Date of Birth: 1951/03/09

## 2017-10-31 ENCOUNTER — Ambulatory Visit: Payer: Medicare Other | Attending: Orthopedic Surgery | Admitting: Occupational Therapy

## 2017-10-31 DIAGNOSIS — I890 Lymphedema, not elsewhere classified: Secondary | ICD-10-CM | POA: Diagnosis present

## 2017-10-31 NOTE — Therapy (Signed)
[place]  [facility] [address] [place], [place], [address] Phone: [phone]   Fax:  [phone]  Occupational Therapy Treatment  Patient Details  Name: Benjamin Nicholson MRN: [id number] Date of Birth: 10/05/1950 Referring Provider: James P. Hooten, MD   Encounter Date: 10/31/2017  OT End of Session - 10/31/17 1202    Visit Number  7    Number of Visits  36    Date for OT Re-Evaluation  12/20/17    OT Start Time  0900    OT Stop Time  1005    OT Time Calculation (min)  65 min    Equipment Utilized During Treatment  1004    Activity Tolerance  Patient tolerated treatment well;No increased pain    Behavior During Therapy  WFL for tasks assessed/performed       Past Medical History:  Diagnosis Date  . Asthma   . Diabetes mellitus without complication ([facility])   . Diverticulosis   . Dyspnea   . Environmental and seasonal allergies   . GERD (gastroesophageal reflux disease)   . Hypertension   . Hypogonadism in male   . Osteoarthritis   . Prepatellar bursitis   . Sleep apnea     Past Surgical History:  Procedure Laterality Date  . ANKLE SURGERY Left   . COLONOSCOPY WITH PROPOFOL N/A 09/20/2014   Procedure: COLONOSCOPY WITH PROPOFOL;  Surgeon: Martin U Skulskie, MD;  Location: [facility] ENDOSCOPY;  Service: Endoscopy;  Laterality: N/A;  . EYE SURGERY    . KNEE ARTHROPLASTY Left 07/26/2016   Procedure: COMPUTER ASSISTED TOTAL KNEE ARTHROPLASTY;  Surgeon: James P Hooten, MD;  Location: [facility] ORS;  Service: Orthopedics;  Laterality: Left;  . LEG SURGERY Left    Fracture    There were no vitals filed for this visit.       LYMPHEDEMA/ONCOLOGY QUESTIONNAIRE - 10/31/17 1215      Left Lower Extremity Lymphedema   Other  6              OT Treatments/Exercises (OP) - 10/31/17 0001      ADLs   ADL Education Given  Yes      Manual Therapy   Manual Therapy  Edema management;Manual Lymphatic Drainage (MLD);Compression  Bandaging    Manual therapy comments  comparative limb volumetrics calculated at 3 landmarks bilaterally    Edema Management  skin care w/ castor oil to RLE during MLD to improve hydration and flexibility    Manual Lymphatic Drainage (MLD)  MLD to RLE/RLQ utilizing short neck sequence, deep abdominal pathways, functional iipsilateral inguinal LN, and thigh, bottleneck            and leg/ fott s Jstrokes from proximal to distal. Pt performed diaphragmatic breathing as instructed.    Compression Bandaging  Max A to apply RLE fthigh length compression  stockinggradfient compression wraps applied using short stretch wraps w/ low resting pressure. SS wraps w/ graduated widths applied from foot to groin over cotton stockinett laer first, then 0.4 cm thick Rosidal foam to groin.              OT Education - 10/31/17 1202    Education Details  Continued skilled Pt/caregiver education  And LE ADL training throughout visit for lymphedema self care/ home program, including compression wrapping, compression garment and device wear/care, lymphatic pumping ther ex, simple self-MLD, and skin care. Discussed progress towards goals.     Person(s) Educated  Patient    Methods    Explanation;Demonstration    Comprehension  Verbalized understanding;Returned demonstration          OT Long Term Goals - 10/17/17 1008      OT LONG TERM GOAL #1   Title  Pt independent w/ lymphedema precautions/prevention principals and using printed reference to limit LE progression and infection risk.  (Pended)     Baseline  Max A  GOAL MET 09/26/2017  (Pended)     Time  2  (Pended)     Period  Weeks  (Pended)     Status  Achieved  (Pended)       OT LONG TERM GOAL #2   Title  Lymphedema (LE) management/ self-care: Pt able to apply thigh length gradient compression wraps with mod assist by spouse  between OT visits using correct gradient techniques within 2 visits to achieve optimal limb volume reduction during Intensive Phase  of CDT and optimal self-management over time.  (Pended)     Baseline  max A ;MET 09/26/2017  (Pended)     Time  2  (Pended)     Period  Weeks  (Pended)     Status  Achieved  (Pended)       OT LONG TERM GOAL #3   Title  Lymphedema (LE) management/ self-care:  Pt to achieve at least 10%  limb volume reductions bilaterally during Intensive Phase CDT to limit LE progression, to decrease infection risk and to improve functional mobility and ambulation essential for optimal ADLs performance.  (Pended)     Baseline  max A  (Pended)     Time  12  (Pended)     Period  Weeks  (Pended)     Status  Achieved  (Pended)       OT LONG TERM GOAL #4   Title  Lymphedema (LE) management/ self-care:  Pt to tolerate daily compression garments and/ or HOS devices in keeping w/ prescribed wear regime within 1 week of issue date to restore normal limb shape to reduce tissue density and protein build up leading to progression.  (Pended)     Baseline  max A  (Pended)     Time  12  (Pended)     Period  Weeks  (Pended)     Status  Achieved  (Pended)       OT LONG TERM GOAL #5   Title  Lymphedema (LE) management/ self-care:  During Management Phase CDT Pt to sustain reduced limb volumes achieved during Intensive Phase CDT at follow up visits during Management Phase CDT within 3% using LE self-care home program components as directed ( simple self MLD, skin care, ther ex and daily/ nightly compression garments/ devices) .  (Pended)     Baseline  max A  (Pended)     Time  6  (Pended)     Period  Months  (Pended)     Status  On-going  (Pended)             Plan - 10/31/17 1209    Clinical Impression Statement  Completed comparative limb volumetrics for BLE w/  Measurements taken at 4 landmarks below the knee. Pt and spouse have been collecting and recording these data at home between vsits. Based on 18 data points  since commencing CDT on 09/22/17 RLE is decreased  at ankle circumference  (Ca) by 2.5 cm, or 8.9%. ;  RLE reduction at least ankle (CB) measures 3.0 cm, or 10.17%.  RLE reduction at widest calf measures 5 cm, or   1.2%; and RLE reduction at knee crease measures 5 cm , or 11.9%.  LLE is decreased  at ankle circumference  (Ca) by 3.5cm, or 12.01%. ; LLE reduction at least ankle (CB) measures 2.7 cm, or 8.52%%.  LLE reduction at widest calf measures 5.5 cm, or 12.08%;  and LLE reduction at knee crease Ec)  measures 7.5 cm , 16.48%.  These values demonstrate Pt has met and exceeded 10% limb volume reduction goals for BLE. Pt tolerated MLD, skin acre and compression garments at end of session. Cont as per POC.     Occupational performance deficits (Please refer to evaluation for details):  ADL's;IADL's;Work;Leisure;Social Participation    Rehab Potential  Excellent    OT Frequency  1x / week    OT Duration  Other (comment) and PRN to assist w/ swelling reduction  p TKA and garment replacements    OT Treatment/Interventions  Self-care/ADL training;Therapeutic exercise;Manual lymph drainage;Compression bandaging;Patient/family education;Other (comment);Functional Mobility Training;Therapeutic activities;DME and/or AE instruction;Manual Therapy skin care/ scar massage    Plan  fit with BLE compression garments- ccl 2- Consider thigh highs vs knee length garments to facilitateimproved  lymphatic c circulation through the lymphatic bottleneck at medial knee and  proximally of knees towards functinoal inguinal LN    Clinical Decision Making  Several treatment options, min-mod task modification necessary    OT Home Exercise Plan  lymphatic pumping ther ex, simple self MLD, compression wraping and garments skin care,     Consulted and Agree with Plan of Care  Patient;Family member/caregiver       Patient will benefit from skilled therapeutic intervention in order to improve the following deficits and impairments:  Abnormal gait, Decreased knowledge of use of DME, Decreased skin integrity, Increased edema, Impaired  flexibility, Pain, Decreased mobility, Impaired sensation, Decreased activity tolerance, Decreased range of motion, Decreased balance, Decreased knowledge of precautions, Difficulty walking  Visit Diagnosis: Lymphedema, not elsewhere classified    Problem List Patient Active Problem List   Diagnosis Date Noted  . S/P total knee arthroplasty 07/26/2016  . Primary osteoarthritis of left knee 01/09/2016  . Left knee pain 10/13/2015  . Essential hypertension 11/08/2014  . Gastroesophageal reflux disease without esophagitis 11/08/2014  . Hypogonadism in male 11/08/2014  . Mild persistent asthma 11/08/2014  . Sleep disorder 11/08/2014  . Type 2 diabetes mellitus, controlled ([facility]) 11/08/2014   Theresa Gilliam, MS, OTR/L, CLT-LANA 10/31/17 12:25 PM   [place] [facility] [address] [place], [place], [address] Phone: [phone]   Fax:  [phone]  Name: Benjamin Nicholson MRN: [id number] Date of Birth: 04/01/1950 

## 2017-11-07 ENCOUNTER — Encounter: Payer: Medicare Other | Admitting: Occupational Therapy

## 2017-11-09 ENCOUNTER — Ambulatory Visit: Payer: Medicare Other | Admitting: Occupational Therapy

## 2017-11-09 DIAGNOSIS — I890 Lymphedema, not elsewhere classified: Secondary | ICD-10-CM | POA: Diagnosis not present

## 2017-11-10 NOTE — Therapy (Signed)
[place] MAIN [facility] SERVICES [address] [place], [place], [address] Phone: [phone]   Fax:  [phone]  Occupational Therapy Treatment  Patient Details  Name: Benjamin Nicholson MRN: [id number] Date of Birth: Sep 20, 1950 Referring Provider: Jeneen Rinks P. Marry Guan, MD   Encounter Date: 11/09/2017  OT End of Session - 11/09/17 1417    Visit Number  8    Number of Visits  36    Date for OT Re-Evaluation  12/20/17    OT Start Time  0903    OT Stop Time  1000    OT Time Calculation (min)  57 min    Equipment Utilized During Treatment  1004    Activity Tolerance  Patient tolerated treatment well;No increased pain    Behavior During Therapy  WFL for tasks assessed/performed       Past Medical History:  Diagnosis Date  . Asthma   . Diabetes mellitus without complication ([place])   . Diverticulosis   . Dyspnea   . Environmental and seasonal allergies   . GERD (gastroesophageal reflux disease)   . Hypertension   . Hypogonadism in male   . Osteoarthritis   . Prepatellar bursitis   . Sleep apnea     Past Surgical History:  Procedure Laterality Date  . ANKLE SURGERY Left   . COLONOSCOPY WITH PROPOFOL N/A 09/20/2014   Procedure: COLONOSCOPY WITH PROPOFOL;  Surgeon: Lollie Sails, MD;  Location: [facility] ENDOSCOPY;  Service: Endoscopy;  Laterality: N/A;  . EYE SURGERY    . KNEE ARTHROPLASTY Left 07/26/2016   Procedure: COMPUTER ASSISTED TOTAL KNEE ARTHROPLASTY;  Surgeon: Dereck Leep, MD;  Location: [facility] ORS;  Service: Orthopedics;  Laterality: Left;  . LEG SURGERY Left    Fracture    There were no vitals filed for this visit.  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 Pt brings new knee length , ccl 2 astockings to clinic.    Patient is accompained by:  Family member    Pertinent History  HTN, asthma, type II  diabetes, ORIF L ankle , L TKA 2018, R TKA pending    Limitations  difficulty fitting street sjoes and lower body clothing, leg swelling and sensations of heaviness, fullness, and tightness increase w/ extended dependent positioning when driving, riding in car, sitting , standing, walking; difficulty reaching feet to don      LB clothing and shoes    Patient Stated Goals  reduce kleg swelling and keep it down, especially in my R leg in prep for knee surgery    Currently in Pain?  Yes   chronic arthritis pain in R knee   unchanged.   Pain Onset  1 to 4 weeks ago   chronic                  OT Treatments/Exercises (OP) - 11/10/17 0001      ADLs   ADL Education Given  Yes      Manual Therapy   Manual Therapy  Edema management             OT Education - 11/10/17 1416    Education Details  Entire session devoted to Pt edu for donning and doffing compression garments using assistive devices.    Person(s) Educated  Patient    Methods  Explanation;Demonstration  Comprehension  Verbalized understanding;Returned demonstration          OT Long Term Goals - 11/09/17 1011      OT LONG TERM GOAL #1   Title  Pt independent w/ lymphedema precautions/prevention principals and using printed reference to limit LE progression and infection risk.    Baseline  Max A  GOAL MET 09/26/2017    Time  2    Period  Weeks    Status  Achieved      OT LONG TERM GOAL #2   Title  Lymphedema (LE) management/ self-care: Pt able to apply thigh length gradient compression wraps with mod assist by spouse  between OT visits using correct gradient techniques within 2 visits to achieve optimal limb volume reduction during Intensive Phase of CDT and optimal self-management over time.    Baseline  max A ;MET 09/26/2017    Time  2    Period  Weeks    Status  Achieved      OT LONG TERM GOAL #3   Title  Lymphedema (LE) management/ self-care:  Pt to achieve at least 10%  limb volume reductions  bilaterally during Intensive Phase CDT to limit LE progression, to decrease infection risk and to improve functional mobility and ambulation essential for optimal ADLs performance.    Baseline  max A    Time  12    Period  Weeks    Status  Achieved      OT LONG TERM GOAL #4   Title  Lymphedema (LE) management/ self-care:  Pt to tolerate daily compression garments and/ or HOS devices in keeping w/ prescribed wear regime within 1 week of issue date to restore normal limb shape to reduce tissue density and protein build up leading to progression.    Baseline  max A    Time  12    Period  Weeks    Status  Achieved      OT LONG TERM GOAL #5   Title  Lymphedema (LE) management/ self-care:  During Management Phase CDT Pt to sustain reduced limb volumes achieved during Intensive Phase CDT at follow up visits during Management Phase CDT within 3% using LE self-care home program components as directed ( simple self MLD, skin care, ther ex and daily/ nightly compression garments/ devices) .    Baseline  max A    Time  6    Period  Months    Status  On-going            Plan - 11/10/17 1417    Clinical Impression Statement  Emphasis of visit today on Pt edu for donning and doffing compression garments using a variety of assistive devices to determine which one requires least physical effort and least discomfort. Pt able to use [employer]  ( large donning frame) with fricton gloves and  tyvek sock fabricated by therapist to don both stockings with modified assistance ( extra  time). Pt able to use Mediven Butler OFF after skilled training, but he will try using friction pad alone over the weekend . Excellent session. Cont as per POC.    Occupational performance deficits (Please refer to evaluation for details):  ADL's;IADL's;Work;Leisure;Social Participation    Rehab Potential  Excellent    OT Frequency  1x / week    OT Duration  Other (comment)   and PRN to assist w/ swelling reduction  p TKA  and garment replacements   OT Treatment/Interventions  Self-care/ADL training;Therapeutic exercise;Manual lymph drainage;Compression bandaging;Patient/family education;Other (  comment);Therapist, nutritional;Therapeutic activities;DME and/or AE instruction;Manual Therapy   skin care/ scar massage   Plan  fit with BLE compression garments- ccl 2- Consider thigh highs vs knee length garments to facilitateimproved  lymphatic c circulation through the lymphatic bottleneck at medial knee and  proximally of knees towards functinoal inguinal LN    Clinical Decision Making  Several treatment options, min-mod task modification necessary    OT Home Exercise Plan  lymphatic pumping ther ex, simple self MLD, compression wraping and garments skin care,     Consulted and Agree with Plan of Care  Patient;Family member/caregiver       Patient will benefit from skilled therapeutic intervention in order to improve the following deficits and impairments:  Abnormal gait, Decreased knowledge of use of DME, Decreased skin integrity, Increased edema, Impaired flexibility, Pain, Decreased mobility, Impaired sensation, Decreased activity tolerance, Decreased range of motion, Decreased balance, Decreased knowledge of precautions, Difficulty walking  Visit Diagnosis: Lymphedema, not elsewhere classified    Problem List Patient Active Problem List   Diagnosis Date Noted  . S/P total knee arthroplasty 07/26/2016  . Primary osteoarthritis of left knee 01/09/2016  . Left knee pain 10/13/2015  . Essential hypertension 11/08/2014  . Gastroesophageal reflux disease without esophagitis 11/08/2014  . Hypogonadism in male 11/08/2014  . Mild persistent asthma 11/08/2014  . Sleep disorder 11/08/2014  . Type 2 diabetes mellitus, controlled ([place]) 11/08/2014    Andrey Spearman, MS, OTR/L, [facility] 11/10/17 2:22 PM  [place] MAIN [facility] SERVICES [address] [place], [place],  [address] Phone: [phone]   Fax:  [phone]  Name: Benjamin Nicholson MRN: [id number] Date of Birth: 1950/09/22

## 2017-11-14 ENCOUNTER — Ambulatory Visit: Payer: Medicare Other | Admitting: Occupational Therapy

## 2017-11-14 DIAGNOSIS — I890 Lymphedema, not elsewhere classified: Secondary | ICD-10-CM

## 2017-11-15 NOTE — Therapy (Signed)
[place] MAIN [facility] SERVICES [address] [place], [place], [address] Phone: [phone]   Fax:  [phone]  Occupational Therapy Treatment  Patient Details  Name: Benjamin Nicholson MRN: [id number] Date of Birth: 11-04-1950 Referring Provider: Jeneen Rinks P. Marry Guan, MD   Encounter Date: 11/14/2017  OT End of Session - 11/14/17 1622    Visit Number  9    Number of Visits  36    Date for OT Re-Evaluation  12/20/17    OT Start Time  0915    OT Stop Time  1005    OT Time Calculation (min)  50 min    Equipment Utilized During Treatment  1004    Activity Tolerance  Patient tolerated treatment well;No increased pain    Behavior During Therapy  WFL for tasks assessed/performed       Past Medical History:  Diagnosis Date  . Asthma   . Diabetes mellitus without complication ([place])   . Diverticulosis   . Dyspnea   . Environmental and seasonal allergies   . GERD (gastroesophageal reflux disease)   . Hypertension   . Hypogonadism in male   . Osteoarthritis   . Prepatellar bursitis   . Sleep apnea     Past Surgical History:  Procedure Laterality Date  . ANKLE SURGERY Left   . COLONOSCOPY WITH PROPOFOL N/A 09/20/2014   Procedure: COLONOSCOPY WITH PROPOFOL;  Surgeon: Lollie Sails, MD;  Location: [facility] ENDOSCOPY;  Service: Endoscopy;  Laterality: N/A;  . EYE SURGERY    . KNEE ARTHROPLASTY Left 07/26/2016   Procedure: COMPUTER ASSISTED TOTAL KNEE ARTHROPLASTY;  Surgeon: Dereck Leep, MD;  Location: [facility] ORS;  Service: Orthopedics;  Laterality: Left;  . LEG SURGERY Left    Fracture    There were no vitals filed for this visit.  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 Pt brings donning butler to clinic.     Patient is accompained by:  Family member    Pertinent History  HTN, asthma, type II diabetes, ORIF L  ankle , L TKA 2018, R TKA pending    Limitations  difficulty fitting street sjoes and lower body clothing, leg swelling and sensations of heaviness, fullness, and tightness increase w/ extended dependent positioning when driving, riding in car, sitting , standing, walking; difficulty reaching feet to don      LB clothing and shoes    Patient Stated Goals  reduce kleg swelling and keep it down, especially in my R leg in prep for knee surgery    Currently in Pain?  No/denies    Pain Onset  1 to 4 weeks ago   chronic                          OT Education - 11/15/17 1622    Education Details  Continued skilled Pt/caregiver education  And LE ADL training throughout visit for lymphedema self care/ home program, including compression wrapping, compression garment and device wear/care, lymphatic pumping ther ex, simple self-MLD, and skin care. Discussed progress towards goals.     Person(s) Educated  Patient    Methods  Explanation;Demonstration    Comprehension  Verbalized understanding;Returned demonstration          OT Long Term Goals - 11/09/17 1011      OT  LONG TERM GOAL #1   Title  Pt independent w/ lymphedema precautions/prevention principals and using printed reference to limit LE progression and infection risk.    Baseline  Max A  GOAL MET 09/26/2017    Time  2    Period  Weeks    Status  Achieved      OT LONG TERM GOAL #2   Title  Lymphedema (LE) management/ self-care: Pt able to apply thigh length gradient compression wraps with mod assist by spouse  between OT visits using correct gradient techniques within 2 visits to achieve optimal limb volume reduction during Intensive Phase of CDT and optimal self-management over time.    Baseline  max A ;MET 09/26/2017    Time  2    Period  Weeks    Status  Achieved      OT LONG TERM GOAL #3   Title  Lymphedema (LE) management/ self-care:  Pt to achieve at least 10%  limb volume reductions bilaterally during Intensive  Phase CDT to limit LE progression, to decrease infection risk and to improve functional mobility and ambulation essential for optimal ADLs performance.    Baseline  max A    Time  12    Period  Weeks    Status  Achieved      OT LONG TERM GOAL #4   Title  Lymphedema (LE) management/ self-care:  Pt to tolerate daily compression garments and/ or HOS devices in keeping w/ prescribed wear regime within 1 week of issue date to restore normal limb shape to reduce tissue density and protein build up leading to progression.    Baseline  max A    Time  12    Period  Weeks    Status  Achieved      OT LONG TERM GOAL #5   Title  Lymphedema (LE) management/ self-care:  During Management Phase CDT Pt to sustain reduced limb volumes achieved during Intensive Phase CDT at follow up visits during Management Phase CDT within 3% using LE self-care home program components as directed ( simple self MLD, skin care, ther ex and daily/ nightly compression garments/ devices) .    Baseline  max A    Time  6    Period  Months    Status  On-going            Plan - 11/15/17 1623    Clinical Impression Statement  Pt tolerated MLD to RLE without difficulty. After session he was able to don compression stockings using donning butler and friction glovees brought from home with modified assistance. Goal met. Cont as per POC.    Occupational performance deficits (Please refer to evaluation for details):  ADL's;IADL's;Work;Leisure;Social Participation    Rehab Potential  Excellent    OT Frequency  1x / week    OT Duration  Other (comment)   and PRN to assist w/ swelling reduction  p TKA and garment replacements   OT Treatment/Interventions  Self-care/ADL training;Therapeutic exercise;Manual lymph drainage;Compression bandaging;Patient/family education;Other (comment);Therapist, nutritional;Therapeutic activities;DME and/or AE instruction;Manual Therapy   skin care/ scar massage   Plan  fit with BLE compression  garments- ccl 2- Consider thigh highs vs knee length garments to facilitateimproved  lymphatic c circulation through the lymphatic bottleneck at medial knee and  proximally of knees towards functinoal inguinal LN    Clinical Decision Making  Several treatment options, min-mod task modification necessary    OT Home Exercise Plan  lymphatic pumping ther ex, simple self MLD,  compression wraping and garments skin care,     Consulted and Agree with Plan of Care  Patient;Family member/caregiver       Patient will benefit from skilled therapeutic intervention in order to improve the following deficits and impairments:  Abnormal gait, Decreased knowledge of use of DME, Decreased skin integrity, Increased edema, Impaired flexibility, Pain, Decreased mobility, Impaired sensation, Decreased activity tolerance, Decreased range of motion, Decreased balance, Decreased knowledge of precautions, Difficulty walking  Visit Diagnosis: Lymphedema, not elsewhere classified    Problem List Patient Active Problem List   Diagnosis Date Noted  . S/P total knee arthroplasty 07/26/2016  . Primary osteoarthritis of left knee 01/09/2016  . Left knee pain 10/13/2015  . Essential hypertension 11/08/2014  . Gastroesophageal reflux disease without esophagitis 11/08/2014  . Hypogonadism in male 11/08/2014  . Mild persistent asthma 11/08/2014  . Sleep disorder 11/08/2014  . Type 2 diabetes mellitus, controlled ([place]) 11/08/2014    Andrey Spearman, MS, OTR/L, [facility] 11/15/17 4:25 PM   [place] MAIN [facility] SERVICES [address] [place], [place], [address] Phone: [phone]   Fax:  [phone]  Name: Benjamin Nicholson MRN: [id number] Date of Birth: 1950/07/01

## 2017-11-20 NOTE — Discharge Instructions (Signed)
°  Instructions after Total Knee Replacement ° ° Luz Mares P. Erie Sica, Jr., M.D.    ° Dept. of Orthopaedics & Sports Medicine ° [facility] ° [address] ° [place],   [address] ° Phone: [phone]   Fax: [phone] ° °  °DIET: °• Drink plenty of non-alcoholic fluids. °• Resume your normal diet. Include foods high in fiber. ° °ACTIVITY:  °• You may use crutches or a walker with weight-bearing as tolerated, unless instructed otherwise. °• You may be weaned off of the walker or crutches by your Physical Therapist.  °• Do NOT place pillows under the knee. Anything placed under the knee could limit your ability to straighten the knee.   °• Continue doing gentle exercises. Exercising will reduce the pain and swelling, increase motion, and prevent muscle weakness.   °• Please continue to use the TED compression stockings for 6 weeks. You may remove the stockings at night, but should reapply them in the morning. °• Do not drive or operate any equipment until instructed. ° °WOUND CARE:  °• Continue to use the PolarCare or ice packs periodically to reduce pain and swelling. °• You may bathe or shower after the staples are removed at the first office visit following surgery. ° °MEDICATIONS: °• You may resume your regular medications. °• Please take the pain medication as prescribed on the medication. °• Do not take pain medication on an empty stomach. °• You have been given a prescription for a blood thinner (Lovenox or Coumadin). Please take the medication as instructed. (NOTE: After completing a 2 week course of Lovenox, take one Enteric-coated aspirin once a day. This along with elevation will help reduce the possibility of phlebitis in your operated leg.) °• Do not drive or drink alcoholic beverages when taking pain medications. ° °CALL THE OFFICE FOR: °• Temperature above 101 degrees °• Excessive bleeding or drainage on the dressing. °• Excessive swelling, coldness, or paleness of the toes. °• Persistent  nausea and vomiting. ° °FOLLOW-UP:  °• You should have an appointment to return to the office in 10-14 days after surgery. °• Arrangements have been made for continuation of Physical Therapy (either home therapy or outpatient therapy). °  °

## 2017-11-21 ENCOUNTER — Ambulatory Visit: Payer: Medicare Other | Admitting: Occupational Therapy

## 2017-11-21 DIAGNOSIS — I890 Lymphedema, not elsewhere classified: Secondary | ICD-10-CM | POA: Diagnosis not present

## 2017-11-21 NOTE — Therapy (Addendum)
[place] MAIN [facility] SERVICES [address] [place], [place], [address] Phone: [phone]   Fax:  [phone]  Occupational Therapy Treatment Note and Progress Report  Patient Details  Name: Benjamin Nicholson MRN: [id number] Date of Birth: Jul 23, 1950 Referring Provider: Jeneen Rinks P. Marry Guan, MD   Encounter Date: 11/21/2017  OT End of Session - 11/21/17 1102    Visit Number  10    Number of Visits  36    Date for OT Re-Evaluation  12/20/17    OT Start Time  0913    OT Stop Time  1013    OT Time Calculation (min)  60 min    Equipment Utilized During Treatment  1004    Activity Tolerance  Patient tolerated treatment well;No increased pain    Behavior During Therapy  WFL for tasks assessed/performed       Past Medical History:  Diagnosis Date  . Asthma   . Diabetes mellitus without complication ([place])   . Diverticulosis   . Dyspnea   . Environmental and seasonal allergies   . GERD (gastroesophageal reflux disease)   . Hypertension   . Hypogonadism in male   . Osteoarthritis   . Prepatellar bursitis   . Sleep apnea     Past Surgical History:  Procedure Laterality Date  . ANKLE SURGERY Left   . COLONOSCOPY WITH PROPOFOL N/A 09/20/2014   Procedure: COLONOSCOPY WITH PROPOFOL;  Surgeon: Lollie Sails, MD;  Location: [facility] ENDOSCOPY;  Service: Endoscopy;  Laterality: N/A;  . EYE SURGERY    . KNEE ARTHROPLASTY Left 07/26/2016   Procedure: COMPUTER ASSISTED TOTAL KNEE ARTHROPLASTY;  Surgeon: Dereck Leep, MD;  Location: [facility] ORS;  Service: Orthopedics;  Laterality: Left;  . LEG SURGERY Left    Fracture    There were no vitals filed for this visit.  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 Pt brings donning butler to clinic. Pt  has no new complaints. He states that       in order to get  heel of knee length  compression stocking in right place, the stocking itself is too long and he pulls it up over his knee.    Patient is accompained by:  Family member    Pertinent History  HTN, asthma, type II diabetes, ORIF L ankle , L TKA 2018, R TKA pending    Limitations  difficulty fitting street sjoes and lower body clothing, leg swelling and sensations of heaviness, fullness, and tightness increase w/ extended dependent positioning when driving, riding in car, sitting , standing, walking; difficulty reaching feet to don      LB clothing and shoes    Patient Stated Goals  reduce kleg swelling and keep it down, especially in my R leg in prep for knee surgery    Currently in Pain?  Yes    Pain Score  --   not rated numerically   Pain Location  Knee    Pain Orientation  Left    Pain Type  Chronic pain;Other (Comment)   OA pain   Pain Onset  1 to 4 weeks ago   chronic                  OT Treatments/Exercises (OP) - 11/21/17 0001      ADLs   ADL Education Given  Yes      Manual Therapy   Manual Therapy  Edema management;Manual Lymphatic Drainage (MLD);Compression Bandaging    Edema Management  skin care w/ castor oil to RLE during MLD to improve hydration and flexibility    Manual Lymphatic Drainage (MLD)  MLD to LLE utilizing short neck sequence, deep abdominal pathways, functional iipsilateral inguinal LN, and thigh, bottleneck            and leg/ fott s Jstrokes from proximal to distal. Pt performed diaphragmatic breathing as instructed.    Compression Bandaging  Pt modified independent with donning and diffing compression garments using assistive devices ( butlers)             OT Education - 11/21/17 1100    Education Details  Last 15 minutes of session spent providing Pt edu for proper donning technique using donning butler and friction gloves  to achieve correct positioning and distribution of compression  of knee high stockings.     Person(s) Educated  Patient    Methods   Explanation;Demonstration    Comprehension  Verbalized understanding;Returned demonstration          OT Long Term Goals - 11/21/17 1103      OT LONG TERM GOAL #1   Title  Pt independent w/ lymphedema precautions/prevention principals and using printed reference to limit LE progression and infection risk.    Baseline  Max A  GOAL MET 09/26/2017    Time  2    Period  Weeks    Status  Achieved      OT LONG TERM GOAL #2   Title  Lymphedema (LE) management/ self-care: Pt able to apply thigh length gradient compression wraps with mod assist by spouse  between OT visits using correct gradient techniques within 2 visits to achieve optimal limb volume reduction during Intensive Phase of CDT and optimal self-management over time.    Baseline  max A ;MET 09/26/2017    Time  2    Period  Weeks    Status  Achieved      OT LONG TERM GOAL #3   Title  Lymphedema (LE) management/ self-care:  Pt to achieve at least 10%  limb volume reductions bilaterally during Intensive Phase CDT to limit LE progression, to decrease infection risk and to improve functional mobility and ambulation essential for optimal ADLs performance.    Baseline  max A    Time  12    Period  Weeks    Status  Achieved      OT LONG TERM GOAL #4   Title  Lymphedema (LE) management/ self-care:  Pt to tolerate daily compression garments and/ or HOS devices in keeping w/ prescribed wear regime within 1 week of issue date to restore normal limb shape to reduce tissue density and protein build up leading to progression.    Baseline  max A    Time  12    Period  Weeks    Status  Achieved      OT LONG TERM GOAL #5   Title  Lymphedema (LE) management/ self-care:  During Management Phase CDT Pt to sustain reduced limb volumes achieved during Intensive Phase CDT at follow up visits during Management Phase CDT within 3% using LE self-care home program components as directed ( simple self MLD, skin care, ther ex and daily/ nightly compression  garments/ devices) .    Baseline  max A    Time  6    Period  Months  Status  On-going      Long Term Additional Goals   Additional Long Term Goals  Yes      OT LONG TERM GOAL #6   Title  LE Self-Care: Pt able to consistently and correctly don and doff compression garments with modified independence using friction gloves and a donning butler with modified independence  to ensure optimal lymphedema self-management over time.    Baseline  Max A    Time  12    Period  Weeks    Status  New    Target Date  12/20/17            Plan - 11/21/17 1201    Clinical Impression Statement  Provided MLD and skin care to LLE today by Pt request. Reduction of sweling at R knee and distally visibly and palpably decreased by end of session. Provided further Pt LE self-care edu for correct donning of compresion stockings to ensure correct, comfortable distribution of compression. Pt able to use donning butler and friction gloves to don knee highs with mod A after skilled teaching. Reviewed progress towards goals with Pt. Review Long Term GOALs section of note for details. Cont 1 x weekly as per POC.    Occupational performance deficits (Please refer to evaluation for details):  ADL's;IADL's;Work;Leisure;Social Participation    Rehab Potential  Excellent    OT Frequency  1x / week    OT Duration  Other (comment)   and PRN to assist w/ swelling reduction  p TKA and garment replacements   OT Treatment/Interventions  Self-care/ADL training;Therapeutic exercise;Manual lymph drainage;Compression bandaging;Patient/family education;Other (comment);Therapist, nutritional;Therapeutic activities;DME and/or AE instruction;Manual Therapy   skin care/ scar massage   Plan  fit with BLE compression garments- ccl 2- Consider thigh highs vs knee length garments to facilitateimproved  lymphatic c circulation through the lymphatic bottleneck at medial knee and  proximally of knees towards functinoal inguinal LN     Clinical Decision Making  Several treatment options, min-mod task modification necessary    OT Home Exercise Plan  lymphatic pumping ther ex, simple self MLD, compression wraping and garments skin care,     Consulted and Agree with Plan of Care  Patient;Family member/caregiver       Patient will benefit from skilled therapeutic intervention in order to improve the following deficits and impairments:  Abnormal gait, Decreased knowledge of use of DME, Decreased skin integrity, Increased edema, Impaired flexibility, Pain, Decreased mobility, Impaired sensation, Decreased activity tolerance, Decreased range of motion, Decreased balance, Decreased knowledge of precautions, Difficulty walking  Visit Diagnosis: Lymphedema, not elsewhere classified    Problem List Patient Active Problem List   Diagnosis Date Noted  . S/P total knee arthroplasty 07/26/2016  . Primary osteoarthritis of left knee 01/09/2016  . Left knee pain 10/13/2015  . Essential hypertension 11/08/2014  . Gastroesophageal reflux disease without esophagitis 11/08/2014  . Hypogonadism in male 11/08/2014  . Mild persistent asthma 11/08/2014  . Sleep disorder 11/08/2014  . Type 2 diabetes mellitus, controlled ([place]) 11/08/2014    Andrey Spearman, MS, OTR/L, [facility] 11/21/17 12:06 PM  [place] MAIN [facility] SERVICES [address] [place], [place], [address] Phone: [phone]   Fax:  [phone]  Name: Benjamin Nicholson MRN: [id number] Date of Birth: 02-25-1951

## 2017-11-30 ENCOUNTER — Ambulatory Visit: Payer: Medicare Other | Attending: Orthopedic Surgery | Admitting: Occupational Therapy

## 2017-11-30 DIAGNOSIS — I890 Lymphedema, not elsewhere classified: Secondary | ICD-10-CM

## 2017-11-30 NOTE — Therapy (Signed)
[place] MAIN [facility] SERVICES [address] [place], [place], [address] Phone: [phone]   Fax:  [phone]  Occupational Therapy Treatment  Patient Details  Name: Benjamin Nicholson MRN: [id number] Date of Birth: 1950-06-17 Referring Provider: Jeneen Rinks P. Marry Guan, MD   Encounter Date: 11/30/2017  OT End of Session - 11/30/17 1134    Visit Number  11    Number of Visits  36    Date for OT Re-Evaluation  12/20/17    OT Start Time  1000    OT Stop Time  1115    OT Time Calculation (min)  75 min    Equipment Utilized During Treatment  1004    Activity Tolerance  Patient tolerated treatment well;No increased pain    Behavior During Therapy  WFL for tasks assessed/performed       Past Medical History:  Diagnosis Date  . Asthma   . Diabetes mellitus without complication ([place])   . Diverticulosis   . Dyspnea   . Environmental and seasonal allergies   . GERD (gastroesophageal reflux disease)   . Hypertension   . Hypogonadism in male   . Osteoarthritis   . Prepatellar bursitis   . Sleep apnea     Past Surgical History:  Procedure Laterality Date  . ANKLE SURGERY Left   . COLONOSCOPY WITH PROPOFOL N/A 09/20/2014   Procedure: COLONOSCOPY WITH PROPOFOL;  Surgeon: Lollie Sails, MD;  Location: [facility] ENDOSCOPY;  Service: Endoscopy;  Laterality: N/A;  . EYE SURGERY    . KNEE ARTHROPLASTY Left 07/26/2016   Procedure: COMPUTER ASSISTED TOTAL KNEE ARTHROPLASTY;  Surgeon: Dereck Leep, MD;  Location: [facility] ORS;  Service: Orthopedics;  Laterality: Left;  . LEG SURGERY Left    Fracture    There were no vitals filed for this visit.  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 Pt brings donning butler to clinic. Pt  reports he is now able to don compression garments without use of donning butler, but he continues to need  assistance with doffing compression stockings. "I have to wait till my wife gets home."    Patient is accompained by:  Family member    Pertinent History  HTN, asthma, type II diabetes, ORIF L ankle , L TKA 2018, R TKA pending    Limitations  difficulty fitting street sjoes and lower body clothing, leg swelling and sensations of heaviness, fullness, and tightness increase w/ extended dependent positioning when driving, riding in car, sitting , standing, walking; difficulty reaching feet to don      LB clothing and shoes    Patient Stated Goals  reduce kleg swelling and keep it down, especially in my R leg in prep for knee surgery    Currently in Pain?  No/denies    Pain Onset  1 to 4 weeks ago   chronic                  OT Treatments/Exercises (OP) - 11/30/17 0001      ADLs   ADL Education Given  Yes      Manual Therapy   Manual Therapy  Edema management;Manual Traction    Edema Management  skin care w/ castor oil to RLE during MLD to improve hydration and flexibility    Manual Lymphatic Drainage (MLD)  MLD to LLE utilizing  short neck sequence, deep abdominal pathways, functional iipsilateral inguinal LN, and thigh, bottleneck            and leg/ fott s Jstrokes from proximal to distal. Pt performed diaphragmatic breathing as instructed.    Compression Bandaging  Max A to don   garment after Rx 2/2 time constraints.             OT Education - 11/30/17 1133    Education Details  Continued skilled Pt/caregiver education  And LE ADL training throughout visit for lymphedema self care/ home program, including compression wrapping, compression garment and device wear/care, lymphatic pumping ther ex, simple self-MLD, and skin care. Discussed progress towards goals.     Person(s) Educated  Patient    Methods  Explanation;Demonstration    Comprehension  Verbalized understanding;Returned demonstration          OT Long Term Goals - 11/21/17 1103      OT LONG TERM GOAL #1    Title  Pt independent w/ lymphedema precautions/prevention principals and using printed reference to limit LE progression and infection risk.    Baseline  Max A  GOAL MET 09/26/2017    Time  2    Period  Weeks    Status  Achieved      OT LONG TERM GOAL #2   Title  Lymphedema (LE) management/ self-care: Pt able to apply thigh length gradient compression wraps with mod assist by spouse  between OT visits using correct gradient techniques within 2 visits to achieve optimal limb volume reduction during Intensive Phase of CDT and optimal self-management over time.    Baseline  max A ;MET 09/26/2017    Time  2    Period  Weeks    Status  Achieved      OT LONG TERM GOAL #3   Title  Lymphedema (LE) management/ self-care:  Pt to achieve at least 10%  limb volume reductions bilaterally during Intensive Phase CDT to limit LE progression, to decrease infection risk and to improve functional mobility and ambulation essential for optimal ADLs performance.    Baseline  max A    Time  12    Period  Weeks    Status  Achieved      OT LONG TERM GOAL #4   Title  Lymphedema (LE) management/ self-care:  Pt to tolerate daily compression garments and/ or HOS devices in keeping w/ prescribed wear regime within 1 week of issue date to restore normal limb shape to reduce tissue density and protein build up leading to progression.    Baseline  max A    Time  12    Period  Weeks    Status  Achieved      OT LONG TERM GOAL #5   Title  Lymphedema (LE) management/ self-care:  During Management Phase CDT Pt to sustain reduced limb volumes achieved during Intensive Phase CDT at follow up visits during Management Phase CDT within 3% using LE self-care home program components as directed ( simple self MLD, skin care, ther ex and daily/ nightly compression garments/ devices) .    Baseline  max A    Time  6    Period  Months    Status  On-going      Long Term Additional Goals   Additional Long Term Goals  Yes      OT  LONG TERM GOAL #6   Title  LE Self-Care: Pt able to consistently and correctly don and doff compression  garments with modified independence using friction gloves and a donning butler with modified independence  to ensure optimal lymphedema self-management over time.    Baseline  Max A    Time  12    Period  Weeks    Status  New    Target Date  12/20/17            Plan - 11/30/17 1135    Clinical Impression Statement  Provided MLD to RLE with emphasis on medial knwee at lymphatic "bottleneck". Leg is well decongested in prep for upcoming TKA. OT to fabricvate Tyvek doffing device  to try next visit to improve independence with garment wear. Cont as per POC.    Occupational performance deficits (Please refer to evaluation for details):  ADL's;IADL's;Work;Leisure;Social Participation    Rehab Potential  Excellent    OT Frequency  1x / week    OT Duration  Other (comment)   and PRN to assist w/ swelling reduction  p TKA and garment replacements   OT Treatment/Interventions  Self-care/ADL training;Therapeutic exercise;Manual lymph drainage;Compression bandaging;Patient/family education;Other (comment);Therapist, nutritional;Therapeutic activities;DME and/or AE instruction;Manual Therapy   skin care/ scar massage   Plan  fit with BLE compression garments- ccl 2- Consider thigh highs vs knee length garments to facilitateimproved  lymphatic c circulation through the lymphatic bottleneck at medial knee and  proximally of knees towards functinoal inguinal LN    Clinical Decision Making  Several treatment options, min-mod task modification necessary    OT Home Exercise Plan  lymphatic pumping ther ex, simple self MLD, compression wraping and garments skin care,     Consulted and Agree with Plan of Care  Patient;Family member/caregiver       Patient will benefit from skilled therapeutic intervention in order to improve the following deficits and impairments:  Abnormal gait, Decreased  knowledge of use of DME, Decreased skin integrity, Increased edema, Impaired flexibility, Pain, Decreased mobility, Impaired sensation, Decreased activity tolerance, Decreased range of motion, Decreased balance, Decreased knowledge of precautions, Difficulty walking  Visit Diagnosis: Lymphedema, not elsewhere classified    Problem List Patient Active Problem List   Diagnosis Date Noted  . S/P total knee arthroplasty 07/26/2016  . Primary osteoarthritis of left knee 01/09/2016  . Left knee pain 10/13/2015  . Essential hypertension 11/08/2014  . Gastroesophageal reflux disease without esophagitis 11/08/2014  . Hypogonadism in male 11/08/2014  . Mild persistent asthma 11/08/2014  . Sleep disorder 11/08/2014  . Type 2 diabetes mellitus, controlled ([place]) 11/08/2014   Andrey Spearman, MS, OTR/L, [facility] 11/30/17 11:38 AM   [place] MAIN [facility] SERVICES [address] [place], [place], [address] Phone: [phone]   Fax:  [phone]  Name: KORRY DALGLEISH MRN: [id number] Date of Birth: [birth year]

## 2017-12-13 ENCOUNTER — Other Ambulatory Visit: Payer: Self-pay

## 2017-12-13 ENCOUNTER — Encounter
Admission: RE | Admit: 2017-12-13 | Discharge: 2017-12-13 | Disposition: A | Discharge status: Home / Self Care | Payer: Medicare Other | Source: Ambulatory Visit | Attending: Orthopedic Surgery | Admitting: Orthopedic Surgery

## 2017-12-13 DIAGNOSIS — E119 Type 2 diabetes mellitus without complications: Secondary | ICD-10-CM | POA: Diagnosis not present

## 2017-12-13 DIAGNOSIS — I10 Essential (primary) hypertension: Secondary | ICD-10-CM | POA: Insufficient documentation

## 2017-12-13 DIAGNOSIS — R9431 Abnormal electrocardiogram [ECG] [EKG]: Secondary | ICD-10-CM | POA: Diagnosis not present

## 2017-12-13 DIAGNOSIS — Z01818 Encounter for other preprocedural examination: Secondary | ICD-10-CM | POA: Diagnosis not present

## 2017-12-13 HISTORY — DX: Lymphedema, not elsewhere classified: I89.0

## 2017-12-13 LAB — URINALYSIS, ROUTINE W REFLEX MICROSCOPIC
Bilirubin Urine: NEGATIVE
Glucose, UA: NEGATIVE mg/dL
HGB URINE DIPSTICK: NEGATIVE
Ketones, ur: 5 mg/dL — AB
Leukocytes, UA: NEGATIVE
Nitrite: NEGATIVE
PROTEIN: NEGATIVE mg/dL
Specific Gravity, Urine: 1.011 (ref 1.005–1.030)
pH: 5 (ref 5.0–8.0)

## 2017-12-13 LAB — CBC
HCT: 39.1 % — ABNORMAL LOW (ref 40.0–52.0)
Hemoglobin: 13.5 g/dL (ref 13.0–18.0)
MCH: 35.6 pg — AB (ref 26.0–34.0)
MCHC: 34.5 g/dL (ref 32.0–36.0)
MCV: 103.2 fL — AB (ref 80.0–100.0)
Platelets: 274 10*3/uL (ref 150–440)
RBC: 3.79 MIL/uL — ABNORMAL LOW (ref 4.40–5.90)
RDW: 14 % (ref 11.5–14.5)
WBC: 5.2 10*3/uL (ref 3.8–10.6)

## 2017-12-13 LAB — COMPREHENSIVE METABOLIC PANEL
ALT: 57 U/L — AB (ref 0–44)
ANION GAP: 14 (ref 5–15)
AST: 39 U/L (ref 15–41)
Albumin: 4.7 g/dL (ref 3.5–5.0)
Alkaline Phosphatase: 63 U/L (ref 38–126)
BILIRUBIN TOTAL: 0.6 mg/dL (ref 0.3–1.2)
BUN: 19 mg/dL (ref 8–23)
CALCIUM: 9.7 mg/dL (ref 8.9–10.3)
CO2: 22 mmol/L (ref 22–32)
CREATININE: 0.76 mg/dL (ref 0.61–1.24)
Chloride: 101 mmol/L (ref 98–111)
Glucose, Bld: 126 mg/dL — ABNORMAL HIGH (ref 70–99)
Potassium: 4 mmol/L (ref 3.5–5.1)
SODIUM: 137 mmol/L (ref 135–145)
TOTAL PROTEIN: 7.6 g/dL (ref 6.5–8.1)

## 2017-12-13 LAB — SEDIMENTATION RATE: Sed Rate: 37 mm/hr — ABNORMAL HIGH (ref 0–20)

## 2017-12-13 LAB — SURGICAL PCR SCREEN
MRSA, PCR: NEGATIVE
STAPHYLOCOCCUS AUREUS: NEGATIVE

## 2017-12-13 LAB — TYPE AND SCREEN
ABO/RH(D): A POS
Antibody Screen: NEGATIVE

## 2017-12-13 LAB — APTT: aPTT: 28 seconds (ref 24–36)

## 2017-12-13 LAB — PROTIME-INR
INR: 0.9
PROTHROMBIN TIME: 12.1 s (ref 11.4–15.2)

## 2017-12-13 LAB — C-REACTIVE PROTEIN: CRP: <0.8 mg/dL (ref <1.0)

## 2017-12-13 NOTE — Patient Instructions (Signed)
Your procedure is scheduled on: Mon 9/30 Report to Day Surgery. To find out your arrival time please call [phone] between 1PM - 3PM on 9/27  Remember: Instructions that are not followed completely may result in serious medical risk,  up to and including death, or upon the discretion of your surgeon and anesthesiologist your  surgery may need to be rescheduled.     _X__ 1. Do not eat food after midnight the night before your procedure.                 No gum chewing or hard candies. You may drink clear liquids up to 2 hours                 before you are scheduled to arrive for your surgery- DO not drink clear                 liquids within 2 hours of the start of your surgery.                 Clear Liquids include:  water, apple juice without pulp, clear carbohydrate                 drink such as Clearfast of Gatorade, Black Coffee or Tea (Do not add                 anything to coffee or tea).  __X__2.  On the morning of surgery brush your teeth with toothpaste and water, you                may rinse your mouth with mouthwash if you wish.  Do not swallow any toothpaste of mouthwash.     ___ 3.  No Alcohol for 24 hours before or after surgery.   ___ 4.  Do Not Smoke or use e-cigarettes For 24 Hours Prior to Your Surgery.                 Do not use any chewable tobacco products for at least 6 hours prior to                 surgery.  ____  5.  Bring all medications with you on the day of surgery if instructed.   __x__  6.  Notify your doctor if there is any change in your medical condition      (cold, fever, infections).     Do not wear jewelry, make-up, hairpins, clips or nail polish. Do not wear lotions, powders, or perfumes. You may wear deodorant. Do not shave 48 hours prior to surgery. Men may shave face and neck. Do not bring valuables to the hospital.    [facility] is not responsible for any belongings or valuables.  Contacts, dentures or  bridgework may not be worn into surgery. Leave your suitcase in the car. After surgery it may be brought to your room. For patients admitted to the hospital, discharge time is determined by your treatment team.   Patients discharged the day of surgery will not be allowed to drive home.   Please read over the following fact sheets that you were given:    _x___ Take these medicines the morning of surgery with A SIP OF WATER:    1. amLODipine (NORVASC) 5 MG tablet  2. fluticasone (FLONASE) 50 MCG/ACT nasal spray  3. omeprazole (PRILOSEC) 20 MG capsule take extra dose the night before and the morning  4.  5.  6.  ____ Fleet Enema (as directed)   ____ Use CHG Soap as directed  _x___ Use inhalers on the day of surgerybudesonide-formoterol (SYMBICORT) 160-4.5 MCG/ACT inhaler, albuterol (PROVENTIL HFA;VENTOLIN HFA) 108 (90 BASE) MCG/ACT inhaler and bring with you.  __x__ Stop metformin 2 days prior to surgery last dose on Friday 9/27    ____ Take 1/2 of usual insulin dose the night before surgery. No insulin the morning          of surgery.   __x__ Stop aspirin on 9/23  ___x_ Stop Anti-inflammatories ibuprofen or aleve on 9/23  Tylenol OK    ____ Stop supplements until after surgery.    ____ Bring C-Pap to the hospital.

## 2017-12-13 NOTE — Pre-Procedure Instructions (Signed)
AS REQUESTED BY DR P CARROLL, EKG/REQUEST FOR CLEARANCE CALLED AND FAXED TO DR Lattie Haw [place]. SPOKE WITH TIA/ LM FOR JUAN NURSE.Marland Kitchen ALSO FAXED [place].

## 2017-12-13 NOTE — Pre-Procedure Instructions (Signed)
FAX TO DR [place] FAILED X 5 . OFFICE DOES NOT HAVE ALTERNATE NUMBER. WILL REFAX EARLY 12/14/17

## 2017-12-14 LAB — HEMOGLOBIN A1C
HEMOGLOBIN A1C: 6.5 % — AB (ref 4.8–5.6)
MEAN PLASMA GLUCOSE: 140 mg/dL

## 2017-12-14 NOTE — Pre-Procedure Instructions (Signed)
FAX FOR CLEARANCE FAXED TO DR [place]. RECEIVED CONFIRMATION. LABS FAXED TO DR Marry Guan

## 2017-12-15 LAB — URINE CULTURE
Culture: NO GROWTH
Special Requests: NORMAL

## 2017-12-16 DIAGNOSIS — I119 Hypertensive heart disease without heart failure: Secondary | ICD-10-CM | POA: Insufficient documentation

## 2017-12-20 ENCOUNTER — Ambulatory Visit: Payer: Medicare Other | Admitting: Occupational Therapy

## 2017-12-20 DIAGNOSIS — I890 Lymphedema, not elsewhere classified: Secondary | ICD-10-CM

## 2017-12-20 NOTE — Pre-Procedure Instructions (Addendum)
TELEPHONE CALL FROM DR [place] STATING NO CHANGE IN EKG FROM PREVIOUS AND CLEARED FOR SURGERY. REQUESTED [place] FAXED. ELAINE AT DR HOOTEN'S NOTIFIED

## 2017-12-20 NOTE — Therapy (Signed)
[place] MAIN [facility] SERVICES [address] [place], [place], [address] Phone: [phone]   Fax:  [phone]  Occupational Therapy Treatment  Patient Details  Name: Benjamin Nicholson MRN: [id number] Date of Birth: 23-Nov-1950 Referring Provider: Jeneen Rinks P. Marry Guan, MD   Encounter Date: 12/20/2017  OT End of Session - 12/20/17 1637    Visit Number  12    Number of Visits  36    Date for OT Re-Evaluation  12/20/17    OT Start Time  1017    OT Stop Time  1110    OT Time Calculation (min)  53 min    Equipment Utilized During Treatment  1004    Activity Tolerance  Patient tolerated treatment well;No increased pain    Behavior During Therapy  WFL for tasks assessed/performed       Past Medical History:  Diagnosis Date  . Asthma   . Diabetes mellitus without complication ([place])   . Diverticulosis   . Dyspnea   . Environmental and seasonal allergies   . GERD (gastroesophageal reflux disease)   . Hypertension   . Hypogonadism in male   . Lymph edema    primarily left / slight on right  . Osteoarthritis   . Prepatellar bursitis   . Sleep apnea    no cpap/ no sleep study    Past Surgical History:  Procedure Laterality Date  . ANKLE SURGERY Left   . COLONOSCOPY WITH PROPOFOL N/A 09/20/2014   Procedure: COLONOSCOPY WITH PROPOFOL;  Surgeon: Lollie Sails, MD;  Location: [facility] ENDOSCOPY;  Service: Endoscopy;  Laterality: N/A;  . EYE SURGERY     lasik  . JOINT REPLACEMENT     left total knee  . KNEE ARTHROPLASTY Left 07/26/2016   Procedure: COMPUTER ASSISTED TOTAL KNEE ARTHROPLASTY;  Surgeon: Dereck Leep, MD;  Location: [facility] ORS;  Service: Orthopedics;  Laterality: Left;  . LEG SURGERY Left    Fracture    There were no vitals filed for this visit.  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 Visit was unscheduled, but was  able to work patient intoday so he could" get one last treatment before my surgery on Monday."     Patient is accompained by:  Family member    Pertinent History  HTN, asthma, type II diabetes, ORIF L ankle , L TKA 2018, R TKA pending    Limitations  difficulty fitting street sjoes and lower body clothing, leg swelling and sensations of heaviness, fullness, and tightness increase w/ extended dependent positioning when driving, riding in car, sitting , standing, walking; difficulty reaching feet to don      LB clothing and shoes    Patient Stated Goals  reduce kleg swelling and keep it down, especially in my R leg in prep for knee surgery    Pain Onset  1 to 4 weeks ago   chronic                  OT Treatments/Exercises (OP) - 12/20/17 0001      ADLs   ADL Education Given  Yes      Manual Therapy   Manual Therapy  Edema management;Manual Lymphatic Drainage (MLD)             OT Education - 12/20/17 1638    Education Details  Reviewed simple self  MLD to decrease post surgcal swelling. Pt independent with MLD. Spouse is also able to perform very werll    Person(s) Educated  Patient    Methods  Explanation;Demonstration    Comprehension  Verbalized understanding;Returned demonstration          OT Long Term Goals - 11/21/17 1103      OT LONG TERM GOAL #1   Title  Pt independent w/ lymphedema precautions/prevention principals and using printed reference to limit LE progression and infection risk.    Baseline  Max A  GOAL MET 09/26/2017    Time  2    Period  Weeks    Status  Achieved      OT LONG TERM GOAL #2   Title  Lymphedema (LE) management/ self-care: Pt able to apply thigh length gradient compression wraps with mod assist by spouse  between OT visits using correct gradient techniques within 2 visits to achieve optimal limb volume reduction during Intensive Phase of CDT and optimal self-management over time.    Baseline  max A ;MET 09/26/2017    Time  2     Period  Weeks    Status  Achieved      OT LONG TERM GOAL #3   Title  Lymphedema (LE) management/ self-care:  Pt to achieve at least 10%  limb volume reductions bilaterally during Intensive Phase CDT to limit LE progression, to decrease infection risk and to improve functional mobility and ambulation essential for optimal ADLs performance.    Baseline  max A    Time  12    Period  Weeks    Status  Achieved      OT LONG TERM GOAL #4   Title  Lymphedema (LE) management/ self-care:  Pt to tolerate daily compression garments and/ or HOS devices in keeping w/ prescribed wear regime within 1 week of issue date to restore normal limb shape to reduce tissue density and protein build up leading to progression.    Baseline  max A    Time  12    Period  Weeks    Status  Achieved      OT LONG TERM GOAL #5   Title  Lymphedema (LE) management/ self-care:  During Management Phase CDT Pt to sustain reduced limb volumes achieved during Intensive Phase CDT at follow up visits during Management Phase CDT within 3% using LE self-care home program components as directed ( simple self MLD, skin care, ther ex and daily/ nightly compression garments/ devices) .    Baseline  max A    Time  6    Period  Months    Status  On-going      Long Term Additional Goals   Additional Long Term Goals  Yes      OT LONG TERM GOAL #6   Title  LE Self-Care: Pt able to consistently and correctly don and doff compression garments with modified independence using friction gloves and a donning butler with modified independence  to ensure optimal lymphedema self-management over time.    Baseline  Max A    Time  12    Period  Weeks    Status  New    Target Date  12/20/17            Plan - 12/20/17 1640    Clinical Impression Statement  Provided MLD to RLE with emphasis on medial knee at lymphatic "bottleneck". Leg is well decongested in prep for upcoming TKA. Reviewed simple self-MLD protocol  to use for post op  swelling PRN. Pt's leg swelling is well managed today and he has all the tools and strategies he needs in place to assist w/ post op swelling. Pt will F/u prn.    Occupational performance deficits (Please refer to evaluation for details):  ADL's;IADL's;Work;Leisure;Social Participation    Rehab Potential  Excellent    OT Frequency  1x / week    OT Duration  Other (comment)   and PRN to assist w/ swelling reduction  p TKA and garment replacements   OT Treatment/Interventions  Self-care/ADL training;Therapeutic exercise;Manual lymph drainage;Compression bandaging;Patient/family education;Other (comment);Therapist, nutritional;Therapeutic activities;DME and/or AE instruction;Manual Therapy   skin care/ scar massage   Plan  fit with BLE compression garments- ccl 2- Consider thigh highs vs knee length garments to facilitateimproved  lymphatic c circulation through the lymphatic bottleneck at medial knee and  proximally of knees towards functinoal inguinal LN    Clinical Decision Making  Several treatment options, min-mod task modification necessary    OT Home Exercise Plan  lymphatic pumping ther ex, simple self MLD, compression wraping and garments skin care,     Consulted and Agree with Plan of Care  Patient;Family member/caregiver       Patient will benefit from skilled therapeutic intervention in order to improve the following deficits and impairments:  Abnormal gait, Decreased knowledge of use of DME, Decreased skin integrity, Increased edema, Impaired flexibility, Pain, Decreased mobility, Impaired sensation, Decreased activity tolerance, Decreased range of motion, Decreased balance, Decreased knowledge of precautions, Difficulty walking  Visit Diagnosis: Lymphedema, not elsewhere classified    Problem List Patient Active Problem List   Diagnosis Date Noted  . S/P total knee arthroplasty 07/26/2016  . Primary osteoarthritis of left knee 01/09/2016  . Left knee pain 10/13/2015  .  Essential hypertension 11/08/2014  . Gastroesophageal reflux disease without esophagitis 11/08/2014  . Hypogonadism in male 11/08/2014  . Mild persistent asthma 11/08/2014  . Sleep disorder 11/08/2014  . Type 2 diabetes mellitus, controlled ([place]) 11/08/2014    Andrey Spearman, MS, OTR/L, [facility] 12/20/17 4:44 PM  [place] MAIN [facility] SERVICES [address] [place], [place], [address] Phone: [phone]   Fax:  [phone]  Name: Benjamin Nicholson MRN: [id number] Date of Birth: 07-Sep-1950

## 2017-12-22 NOTE — Pre-Procedure Instructions (Signed)
WRITTEN CLEARANCE RECEIVED AND ON CHART. MEDIUM RISK

## 2017-12-25 MED ORDER — TRANEXAMIC ACID 1000 MG/10ML IV SOLN
1000.0000 mg | INTRAVENOUS | Status: DC
  Filled 2017-12-25: qty 10

## 2017-12-25 MED ORDER — CEFAZOLIN SODIUM-DEXTROSE 2-4 GM/100ML-% IV SOLN: 2.0000 g | INTRAVENOUS | Status: DC

## 2017-12-26 ENCOUNTER — Inpatient Hospital Stay: Payer: Medicare Other

## 2017-12-26 ENCOUNTER — Inpatient Hospital Stay
Admission: RE | Admit: 2017-12-26 | Discharge: 2017-12-28 | DRG: 470 | Disposition: A | Discharge status: Home Health | Payer: Medicare Other | Attending: Orthopedic Surgery | Admitting: Orthopedic Surgery

## 2017-12-26 ENCOUNTER — Other Ambulatory Visit: Payer: Self-pay

## 2017-12-26 ENCOUNTER — Inpatient Hospital Stay: Payer: Medicare Other | Admitting: Anesthesiology

## 2017-12-26 ENCOUNTER — Encounter
Admission: RE | Disposition: A | Discharge status: Home Health | Payer: Self-pay | Source: Home / Self Care | Attending: Orthopedic Surgery

## 2017-12-26 ENCOUNTER — Encounter: Payer: Self-pay | Admitting: Orthopedic Surgery

## 2017-12-26 ENCOUNTER — Inpatient Hospital Stay (HOSPITAL_COMMUNITY): Payer: Medicare Other

## 2017-12-26 DIAGNOSIS — G4730 Sleep apnea, unspecified: Secondary | ICD-10-CM | POA: Diagnosis present

## 2017-12-26 DIAGNOSIS — I890 Lymphedema, not elsewhere classified: Secondary | ICD-10-CM | POA: Diagnosis present

## 2017-12-26 DIAGNOSIS — K5790 Diverticulosis of intestine, part unspecified, without perforation or abscess without bleeding: Secondary | ICD-10-CM | POA: Diagnosis present

## 2017-12-26 DIAGNOSIS — Z23 Encounter for immunization: Secondary | ICD-10-CM

## 2017-12-26 DIAGNOSIS — Z803 Family history of malignant neoplasm of breast: Secondary | ICD-10-CM

## 2017-12-26 DIAGNOSIS — M25761 Osteophyte, right knee: Secondary | ICD-10-CM | POA: Diagnosis present

## 2017-12-26 DIAGNOSIS — Z833 Family history of diabetes mellitus: Secondary | ICD-10-CM

## 2017-12-26 DIAGNOSIS — Z7984 Long term (current) use of oral hypoglycemic drugs: Secondary | ICD-10-CM | POA: Diagnosis not present

## 2017-12-26 DIAGNOSIS — Z882 Allergy status to sulfonamides status: Secondary | ICD-10-CM | POA: Diagnosis not present

## 2017-12-26 DIAGNOSIS — E291 Testicular hypofunction: Secondary | ICD-10-CM | POA: Diagnosis present

## 2017-12-26 DIAGNOSIS — I10 Essential (primary) hypertension: Secondary | ICD-10-CM | POA: Diagnosis present

## 2017-12-26 DIAGNOSIS — Z7982 Long term (current) use of aspirin: Secondary | ICD-10-CM

## 2017-12-26 DIAGNOSIS — E119 Type 2 diabetes mellitus without complications: Secondary | ICD-10-CM | POA: Diagnosis present

## 2017-12-26 DIAGNOSIS — Z7951 Long term (current) use of inhaled steroids: Secondary | ICD-10-CM

## 2017-12-26 DIAGNOSIS — M1711 Unilateral primary osteoarthritis, right knee: Principal | ICD-10-CM | POA: Diagnosis present

## 2017-12-26 DIAGNOSIS — K219 Gastro-esophageal reflux disease without esophagitis: Secondary | ICD-10-CM | POA: Diagnosis present

## 2017-12-26 DIAGNOSIS — J4530 Mild persistent asthma, uncomplicated: Secondary | ICD-10-CM | POA: Diagnosis present

## 2017-12-26 DIAGNOSIS — Z96652 Presence of left artificial knee joint: Secondary | ICD-10-CM | POA: Diagnosis present

## 2017-12-26 DIAGNOSIS — Z79899 Other long term (current) drug therapy: Secondary | ICD-10-CM

## 2017-12-26 DIAGNOSIS — Z96659 Presence of unspecified artificial knee joint: Secondary | ICD-10-CM

## 2017-12-26 HISTORY — PX: KNEE ARTHROPLASTY: SHX992

## 2017-12-26 LAB — GLUCOSE, CAPILLARY
GLUCOSE-CAPILLARY: 138 mg/dL — AB (ref 70–99)
GLUCOSE-CAPILLARY: 268 mg/dL — AB (ref 70–99)
Glucose-Capillary: 173 mg/dL — ABNORMAL HIGH (ref 70–99)
Glucose-Capillary: 153 mg/dL — ABNORMAL HIGH (ref 70–99)

## 2017-12-26 SURGERY — ARTHROPLASTY, KNEE, TOTAL, USING IMAGELESS COMPUTER-ASSISTED NAVIGATION
Anesthesia: Spinal | Laterality: Right

## 2017-12-26 MED ORDER — LIDOCAINE HCL (PF) 2 % IJ SOLN
INTRAMUSCULAR | Status: AC
  Filled 2017-12-26: qty 10

## 2017-12-26 MED ORDER — FENTANYL CITRATE (PF) 100 MCG/2ML IJ SOLN
INTRAMUSCULAR | Status: AC
  Filled 2017-12-26: qty 2

## 2017-12-26 MED ORDER — METOCLOPRAMIDE HCL 5 MG/ML IJ SOLN: 5.0000 mg | Freq: Three times a day (TID) | INTRAMUSCULAR | Status: DC | PRN

## 2017-12-26 MED ORDER — PHENOL 1.4 % MT LIQD
1.0000 | OROMUCOSAL | Status: DC | PRN
  Filled 2017-12-26: qty 177

## 2017-12-26 MED ORDER — MIDAZOLAM HCL 2 MG/2ML IJ SOLN
INTRAMUSCULAR | Status: AC
  Filled 2017-12-26: qty 2

## 2017-12-26 MED ORDER — BISACODYL 10 MG RE SUPP: 10.0000 mg | Freq: Every day | RECTAL | Status: DC | PRN

## 2017-12-26 MED ORDER — ACETAMINOPHEN 10 MG/ML IV SOLN
INTRAVENOUS | Status: AC
  Filled 2017-12-26: qty 100

## 2017-12-26 MED ORDER — MAGNESIUM HYDROXIDE 400 MG/5ML PO SUSP
30.0000 mL | Freq: Every day | ORAL | Status: DC
  Administered 2017-12-27: 30 mL via ORAL
  Filled 2017-12-26: qty 30

## 2017-12-26 MED ORDER — CELECOXIB 200 MG PO CAPS
ORAL_CAPSULE | ORAL | Status: AC
  Administered 2017-12-26: 400 mg via ORAL
  Filled 2017-12-26: qty 2

## 2017-12-26 MED ORDER — GABAPENTIN 300 MG PO CAPS
ORAL_CAPSULE | ORAL | Status: AC
  Administered 2017-12-26: 300 mg via ORAL
  Filled 2017-12-26: qty 1

## 2017-12-26 MED ORDER — SODIUM CHLORIDE 0.9 % IV SOLN
INTRAVENOUS | Status: DC
  Administered 2017-12-26 (×2): via INTRAVENOUS

## 2017-12-26 MED ORDER — TESTOSTERONE 50 MG/5GM (1%) TD GEL: 2.5000 g | Freq: Every day | TRANSDERMAL | Status: DC

## 2017-12-26 MED ORDER — MENTHOL 3 MG MT LOZG
1.0000 | LOZENGE | OROMUCOSAL | Status: DC | PRN
  Filled 2017-12-26: qty 9

## 2017-12-26 MED ORDER — LOSARTAN POTASSIUM 50 MG PO TABS
100.0000 mg | ORAL_TABLET | Freq: Every day | ORAL | Status: DC
  Administered 2017-12-27 – 2017-12-28 (×2): 100 mg via ORAL
  Filled 2017-12-26 (×2): qty 2

## 2017-12-26 MED ORDER — SODIUM CHLORIDE 0.9 % IV SOLN
INTRAVENOUS | Status: DC | PRN
  Administered 2017-12-26: 30 ug/min via INTRAVENOUS

## 2017-12-26 MED ORDER — MOMETASONE FURO-FORMOTEROL FUM 200-5 MCG/ACT IN AERO
2.0000 | INHALATION_SPRAY | Freq: Two times a day (BID) | RESPIRATORY_TRACT | Status: DC
  Administered 2017-12-26 – 2017-12-27 (×3): 2 via RESPIRATORY_TRACT
  Filled 2017-12-26: qty 8.8

## 2017-12-26 MED ORDER — TETRACAINE HCL 1 % IJ SOLN
INTRAMUSCULAR | Status: DC | PRN
  Administered 2017-12-26: 3 mg via INTRASPINAL

## 2017-12-26 MED ORDER — AMLODIPINE BESYLATE 5 MG PO TABS
5.0000 mg | ORAL_TABLET | Freq: Every day | ORAL | Status: DC
  Administered 2017-12-27 – 2017-12-28 (×2): 5 mg via ORAL
  Filled 2017-12-26 (×2): qty 1

## 2017-12-26 MED ORDER — CEFAZOLIN SODIUM-DEXTROSE 2-4 GM/100ML-% IV SOLN
2.0000 g | Freq: Four times a day (QID) | INTRAVENOUS | Status: AC
  Administered 2017-12-26 – 2017-12-27 (×4): 2 g via INTRAVENOUS
  Filled 2017-12-26 (×6): qty 100

## 2017-12-26 MED ORDER — FENTANYL CITRATE (PF) 100 MCG/2ML IJ SOLN
INTRAMUSCULAR | Status: DC | PRN
  Administered 2017-12-26: 50 ug via INTRAVENOUS
  Administered 2017-12-26 (×2): 25 ug via INTRAVENOUS

## 2017-12-26 MED ORDER — ACETAMINOPHEN 325 MG PO TABS: 325.0000 mg | ORAL_TABLET | Freq: Four times a day (QID) | ORAL | Status: DC | PRN

## 2017-12-26 MED ORDER — PROPOFOL 500 MG/50ML IV EMUL
INTRAVENOUS | Status: DC | PRN
  Administered 2017-12-26: 70 ug/kg/min via INTRAVENOUS

## 2017-12-26 MED ORDER — ATORVASTATIN CALCIUM 20 MG PO TABS
20.0000 mg | ORAL_TABLET | Freq: Every day | ORAL | Status: DC
  Administered 2017-12-27 – 2017-12-28 (×2): 20 mg via ORAL
  Filled 2017-12-26 (×2): qty 1

## 2017-12-26 MED ORDER — FENTANYL CITRATE (PF) 100 MCG/2ML IJ SOLN: 25.0000 ug | INTRAMUSCULAR | Status: DC | PRN

## 2017-12-26 MED ORDER — TRANEXAMIC ACID 1000 MG/10ML IV SOLN
INTRAVENOUS | Status: DC | PRN
  Administered 2017-12-26: 1000 mg via INTRAVENOUS

## 2017-12-26 MED ORDER — FERROUS SULFATE 325 (65 FE) MG PO TABS
325.0000 mg | ORAL_TABLET | Freq: Two times a day (BID) | ORAL | Status: DC
  Administered 2017-12-26 – 2017-12-28 (×4): 325 mg via ORAL
  Filled 2017-12-26 (×4): qty 1

## 2017-12-26 MED ORDER — DEXAMETHASONE SODIUM PHOSPHATE 10 MG/ML IJ SOLN
INTRAMUSCULAR | Status: AC
  Administered 2017-12-26: 8 mg via INTRAVENOUS
  Filled 2017-12-26: qty 1

## 2017-12-26 MED ORDER — PROMETHAZINE HCL 25 MG/ML IJ SOLN: 6.2500 mg | INTRAMUSCULAR | Status: DC | PRN

## 2017-12-26 MED ORDER — METOCLOPRAMIDE HCL 10 MG PO TABS: 5.0000 mg | ORAL_TABLET | Freq: Three times a day (TID) | ORAL | Status: DC | PRN

## 2017-12-26 MED ORDER — PROPOFOL 10 MG/ML IV BOLUS
INTRAVENOUS | Status: DC | PRN
  Administered 2017-12-26 (×5): 20 mg via INTRAVENOUS

## 2017-12-26 MED ORDER — DIPHENHYDRAMINE HCL 12.5 MG/5ML PO ELIX: 12.5000 mg | ORAL_SOLUTION | ORAL | Status: DC | PRN

## 2017-12-26 MED ORDER — DEXAMETHASONE SODIUM PHOSPHATE 10 MG/ML IJ SOLN
8.0000 mg | Freq: Once | INTRAMUSCULAR | Status: AC
  Administered 2017-12-26: 8 mg via INTRAVENOUS

## 2017-12-26 MED ORDER — PROPOFOL 500 MG/50ML IV EMUL
INTRAVENOUS | Status: AC
  Filled 2017-12-26: qty 50

## 2017-12-26 MED ORDER — OXYCODONE HCL 5 MG PO TABS
5.0000 mg | ORAL_TABLET | ORAL | Status: DC | PRN
  Administered 2017-12-26 – 2017-12-27 (×4): 5 mg via ORAL
  Filled 2017-12-26 (×4): qty 1

## 2017-12-26 MED ORDER — CELECOXIB 200 MG PO CAPS
400.0000 mg | ORAL_CAPSULE | Freq: Once | ORAL | Status: AC
  Administered 2017-12-26: 400 mg via ORAL

## 2017-12-26 MED ORDER — HYDROMORPHONE HCL 1 MG/ML IJ SOLN: 0.5000 mg | INTRAMUSCULAR | Status: DC | PRN

## 2017-12-26 MED ORDER — CELECOXIB 200 MG PO CAPS
200.0000 mg | ORAL_CAPSULE | Freq: Two times a day (BID) | ORAL | Status: DC
  Administered 2017-12-26 – 2017-12-28 (×4): 200 mg via ORAL
  Filled 2017-12-26 (×4): qty 1

## 2017-12-26 MED ORDER — METFORMIN HCL ER 500 MG PO TB24
1000.0000 mg | ORAL_TABLET | Freq: Two times a day (BID) | ORAL | Status: DC
  Administered 2017-12-26 – 2017-12-28 (×4): 1000 mg via ORAL
  Filled 2017-12-26 (×5): qty 2

## 2017-12-26 MED ORDER — METOCLOPRAMIDE HCL 10 MG PO TABS
10.0000 mg | ORAL_TABLET | Freq: Three times a day (TID) | ORAL | Status: DC
  Administered 2017-12-26 – 2017-12-28 (×7): 10 mg via ORAL
  Filled 2017-12-26 (×8): qty 1

## 2017-12-26 MED ORDER — TRANEXAMIC ACID 1000 MG/10ML IV SOLN
1000.0000 mg | Freq: Once | INTRAVENOUS | Status: AC
  Administered 2017-12-26: 1000 mg via INTRAVENOUS
  Filled 2017-12-26: qty 1000

## 2017-12-26 MED ORDER — NEOMYCIN-POLYMYXIN B GU 40-200000 IR SOLN
Status: DC | PRN
  Administered 2017-12-26: 14 mL

## 2017-12-26 MED ORDER — PANTOPRAZOLE SODIUM 40 MG PO TBEC
40.0000 mg | DELAYED_RELEASE_TABLET | Freq: Two times a day (BID) | ORAL | Status: DC
  Administered 2017-12-26 – 2017-12-28 (×4): 40 mg via ORAL
  Filled 2017-12-26 (×4): qty 1

## 2017-12-26 MED ORDER — TRAMADOL HCL 50 MG PO TABS
50.0000 mg | ORAL_TABLET | ORAL | Status: DC | PRN
  Administered 2017-12-27: 100 mg via ORAL
  Filled 2017-12-26: qty 2

## 2017-12-26 MED ORDER — OXYCODONE HCL 5 MG PO TABS
10.0000 mg | ORAL_TABLET | ORAL | Status: DC | PRN
  Administered 2017-12-27 – 2017-12-28 (×4): 10 mg via ORAL
  Filled 2017-12-26 (×4): qty 2

## 2017-12-26 MED ORDER — INSULIN ASPART 100 UNIT/ML ~~LOC~~ SOLN
0.0000 [IU] | Freq: Three times a day (TID) | SUBCUTANEOUS | Status: DC
  Administered 2017-12-26: 3 [IU] via SUBCUTANEOUS
  Administered 2017-12-27 – 2017-12-28 (×4): 2 [IU] via SUBCUTANEOUS
  Filled 2017-12-26 (×5): qty 1

## 2017-12-26 MED ORDER — SENNOSIDES-DOCUSATE SODIUM 8.6-50 MG PO TABS
1.0000 | ORAL_TABLET | Freq: Two times a day (BID) | ORAL | Status: DC
  Administered 2017-12-26 – 2017-12-28 (×4): 1 via ORAL
  Filled 2017-12-26 (×4): qty 1

## 2017-12-26 MED ORDER — CHLORHEXIDINE GLUCONATE 4 % EX LIQD: 60.0000 mL | Freq: Once | CUTANEOUS | Status: DC

## 2017-12-26 MED ORDER — ENOXAPARIN SODIUM 30 MG/0.3ML ~~LOC~~ SOLN
30.0000 mg | Freq: Two times a day (BID) | SUBCUTANEOUS | Status: DC
  Administered 2017-12-27 – 2017-12-28 (×3): 30 mg via SUBCUTANEOUS
  Filled 2017-12-26 (×3): qty 0.3

## 2017-12-26 MED ORDER — SODIUM CHLORIDE 0.9 % IV SOLN
INTRAVENOUS | Status: DC | PRN
  Administered 2017-12-26: 60 mL

## 2017-12-26 MED ORDER — BUPIVACAINE HCL (PF) 0.5 % IJ SOLN
INTRAMUSCULAR | Status: DC | PRN
  Administered 2017-12-26: 2.7 mL

## 2017-12-26 MED ORDER — ONDANSETRON HCL 4 MG/2ML IJ SOLN: 4.0000 mg | Freq: Four times a day (QID) | INTRAMUSCULAR | Status: DC | PRN

## 2017-12-26 MED ORDER — CEFAZOLIN SODIUM-DEXTROSE 2-3 GM-%(50ML) IV SOLR
INTRAVENOUS | Status: DC | PRN
  Administered 2017-12-26: 2 g via INTRAVENOUS

## 2017-12-26 MED ORDER — FLUTICASONE PROPIONATE 50 MCG/ACT NA SUSP
1.0000 | Freq: Every day | NASAL | Status: DC | PRN
  Filled 2017-12-26: qty 16

## 2017-12-26 MED ORDER — ALBUTEROL SULFATE (2.5 MG/3ML) 0.083% IN NEBU: 3.0000 mL | INHALATION_SOLUTION | Freq: Four times a day (QID) | RESPIRATORY_TRACT | Status: DC | PRN

## 2017-12-26 MED ORDER — ACETAMINOPHEN 10 MG/ML IV SOLN
INTRAVENOUS | Status: DC | PRN
  Administered 2017-12-26: 1000 mg via INTRAVENOUS

## 2017-12-26 MED ORDER — ALUM & MAG HYDROXIDE-SIMETH 200-200-20 MG/5ML PO SUSP: 30.0000 mL | ORAL | Status: DC | PRN

## 2017-12-26 MED ORDER — GABAPENTIN 300 MG PO CAPS
300.0000 mg | ORAL_CAPSULE | Freq: Once | ORAL | Status: AC
  Administered 2017-12-26: 300 mg via ORAL

## 2017-12-26 MED ORDER — FLEET ENEMA 7-19 GM/118ML RE ENEM: 1.0000 | ENEMA | Freq: Once | RECTAL | Status: DC | PRN

## 2017-12-26 MED ORDER — FUROSEMIDE 20 MG PO TABS
20.0000 mg | ORAL_TABLET | Freq: Every day | ORAL | Status: DC
  Filled 2017-12-26: qty 1

## 2017-12-26 MED ORDER — GABAPENTIN 300 MG PO CAPS
300.0000 mg | ORAL_CAPSULE | Freq: Every day | ORAL | Status: DC
  Administered 2017-12-26 – 2017-12-27 (×2): 300 mg via ORAL
  Filled 2017-12-26 (×2): qty 1

## 2017-12-26 MED ORDER — CALCIUM CARBONATE ANTACID 500 MG PO CHEW: 2.0000 | CHEWABLE_TABLET | Freq: Every day | ORAL | Status: DC | PRN

## 2017-12-26 MED ORDER — MIDAZOLAM HCL 5 MG/5ML IJ SOLN
INTRAMUSCULAR | Status: DC | PRN
  Administered 2017-12-26 (×2): 1 mg via INTRAVENOUS

## 2017-12-26 MED ORDER — ACETAMINOPHEN 10 MG/ML IV SOLN
1000.0000 mg | Freq: Four times a day (QID) | INTRAVENOUS | Status: AC
  Administered 2017-12-26 – 2017-12-27 (×4): 1000 mg via INTRAVENOUS
  Filled 2017-12-26 (×4): qty 100

## 2017-12-26 MED ORDER — BUPIVACAINE HCL (PF) 0.5 % IJ SOLN
INTRAMUSCULAR | Status: AC
  Filled 2017-12-26: qty 10

## 2017-12-26 MED ORDER — HYDROCHLOROTHIAZIDE 25 MG PO TABS
25.0000 mg | ORAL_TABLET | Freq: Every day | ORAL | Status: DC
  Administered 2017-12-26 – 2017-12-27 (×2): 25 mg via ORAL
  Filled 2017-12-26 (×2): qty 1

## 2017-12-26 MED ORDER — CEFAZOLIN SODIUM-DEXTROSE 2-4 GM/100ML-% IV SOLN
INTRAVENOUS | Status: AC
  Filled 2017-12-26: qty 100

## 2017-12-26 MED ORDER — BUPIVACAINE HCL (PF) 0.25 % IJ SOLN
INTRAMUSCULAR | Status: DC | PRN
  Administered 2017-12-26: 60 mL

## 2017-12-26 MED ORDER — GLYCOPYRROLATE 0.2 MG/ML IJ SOLN
INTRAMUSCULAR | Status: DC | PRN
  Administered 2017-12-26: 0.2 mg via INTRAVENOUS

## 2017-12-26 MED ORDER — SODIUM CHLORIDE 0.9 % IV SOLN
INTRAVENOUS | Status: DC
  Administered 2017-12-26 – 2017-12-27 (×2): via INTRAVENOUS

## 2017-12-26 MED ORDER — ONDANSETRON HCL 4 MG PO TABS: 4.0000 mg | ORAL_TABLET | Freq: Four times a day (QID) | ORAL | Status: DC | PRN

## 2017-12-26 SURGICAL SUPPLY — 70 items
ATTUNE MED DOME PAT 41 KNEE (Knees) ×2 IMPLANT
ATTUNE MED DOME PAT 41MM KNEE (Knees) ×1 IMPLANT
ATTUNE PS FEM RT SZ 5 CEM KNEE (Femur) ×3 IMPLANT
ATTUNE PSRP INSE SZ 5 7MM KNEE (Insert) ×1 IMPLANT
ATTUNE PSRP INSE SZ5 7 KNEE (Insert) ×2 IMPLANT
BASE TIBIA ATTUNE KNEE SYS SZ6 (Knees) ×1 IMPLANT
BATTERY INSTRU NAVIGATION (MISCELLANEOUS) ×12 IMPLANT
BLADE SAW 70X12.5 (BLADE) ×3 IMPLANT
BLADE SAW 90X13X1.19 OSCILLAT (BLADE) ×3 IMPLANT
BLADE SAW 90X25X1.19 OSCILLAT (BLADE) ×3 IMPLANT
BONE CEMENT GENTAMICIN (Cement) ×6 IMPLANT
CANISTER SUCT 1200ML W/VALVE (MISCELLANEOUS) ×3 IMPLANT
CANISTER SUCT 3000ML PPV (MISCELLANEOUS) ×6 IMPLANT
CEMENT BONE GENTAMICIN 40 (Cement) ×2 IMPLANT
COOLER POLAR GLACIER W/PUMP (MISCELLANEOUS) ×3 IMPLANT
CUFF TOURN 24 STER (MISCELLANEOUS) IMPLANT
CUFF TOURN 30 STER DUAL PORT (MISCELLANEOUS) ×3 IMPLANT
DRAPE SHEET LG 3/4 BI-LAMINATE (DRAPES) ×3 IMPLANT
DRSG DERMACEA 8X12 NADH (GAUZE/BANDAGES/DRESSINGS) ×3 IMPLANT
DRSG OPSITE POSTOP 4X14 (GAUZE/BANDAGES/DRESSINGS) ×3 IMPLANT
DRSG TEGADERM 4X4.75 (GAUZE/BANDAGES/DRESSINGS) ×3 IMPLANT
DURAPREP 26ML APPLICATOR (WOUND CARE) ×6 IMPLANT
ELECT CAUTERY BLADE 6.4 (BLADE) ×3 IMPLANT
ELECT REM PT RETURN 9FT ADLT (ELECTROSURGICAL) ×3
ELECTRODE REM PT RTRN 9FT ADLT (ELECTROSURGICAL) ×1 IMPLANT
EX-PIN ORTHOLOCK NAV 4X150 (PIN) ×6 IMPLANT
GLOVE BIOGEL M STRL SZ7.5 (GLOVE) ×6 IMPLANT
GLOVE BIOGEL PI IND STRL 9 (GLOVE) ×1 IMPLANT
GLOVE BIOGEL PI INDICATOR 9 (GLOVE) ×2
GLOVE INDICATOR 8.0 STRL GRN (GLOVE) ×3 IMPLANT
GLOVE SURG SYN 9.0  PF PI (GLOVE) ×2
GLOVE SURG SYN 9.0 PF PI (GLOVE) ×1 IMPLANT
GOWN STRL REUS W/ TWL LRG LVL3 (GOWN DISPOSABLE) ×2 IMPLANT
GOWN STRL REUS W/TWL 2XL LVL3 (GOWN DISPOSABLE) ×3 IMPLANT
GOWN STRL REUS W/TWL LRG LVL3 (GOWN DISPOSABLE) ×4
HEMOVAC 400CC 10FR (MISCELLANEOUS) ×3 IMPLANT
HOLDER FOLEY CATH W/STRAP (MISCELLANEOUS) ×3 IMPLANT
HOOD PEEL AWAY FLYTE STAYCOOL (MISCELLANEOUS) ×6 IMPLANT
KIT TURNOVER KIT A (KITS) ×3 IMPLANT
KNIFE SCULPS 14X20 (INSTRUMENTS) ×3 IMPLANT
LABEL OR SOLS (LABEL) ×3 IMPLANT
NDL SAFETY ECLIPSE 18X1.5 (NEEDLE) ×1 IMPLANT
NEEDLE HYPO 18GX1.5 SHARP (NEEDLE) ×2
NEEDLE SPNL 20GX3.5 QUINCKE YW (NEEDLE) ×6 IMPLANT
NS IRRIG 500ML POUR BTL (IV SOLUTION) ×3 IMPLANT
PACK TOTAL KNEE (MISCELLANEOUS) ×3 IMPLANT
PAD WRAPON POLAR KNEE (MISCELLANEOUS) ×1 IMPLANT
PIN DRILL QUICK PACK ×3 IMPLANT
PIN FIXATION 1/8DIA X 3INL (PIN) ×9 IMPLANT
PULSAVAC PLUS IRRIG FAN TIP (DISPOSABLE) ×3
SOL .9 NS 3000ML IRR  AL (IV SOLUTION) ×2
SOL .9 NS 3000ML IRR UROMATIC (IV SOLUTION) ×1 IMPLANT
SOL PREP PVP 2OZ (MISCELLANEOUS) ×3
SOLUTION PREP PVP 2OZ (MISCELLANEOUS) ×1 IMPLANT
SPONGE DRAIN TRACH 4X4 STRL 2S (GAUZE/BANDAGES/DRESSINGS) ×3 IMPLANT
STAPLER SKIN PROX 35W (STAPLE) ×3 IMPLANT
STRAP TIBIA SHORT (MISCELLANEOUS) ×3 IMPLANT
SUCTION FRAZIER HANDLE 10FR (MISCELLANEOUS) ×2
SUCTION TUBE FRAZIER 10FR DISP (MISCELLANEOUS) ×1 IMPLANT
SUT VIC AB 0 CT1 36 (SUTURE) ×3 IMPLANT
SUT VIC AB 1 CT1 36 (SUTURE) ×6 IMPLANT
SUT VIC AB 2-0 CT2 27 (SUTURE) ×3 IMPLANT
SYR 20CC LL (SYRINGE) ×3 IMPLANT
SYR 30ML LL (SYRINGE) ×6 IMPLANT
TIBIA ATTUNE KNEE SYS BASE SZ6 (Knees) ×3 IMPLANT
TIP FAN IRRIG PULSAVAC PLUS (DISPOSABLE) ×1 IMPLANT
TOWEL OR 17X26 4PK STRL BLUE (TOWEL DISPOSABLE) ×3 IMPLANT
TOWER CARTRIDGE SMART MIX (DISPOSABLE) ×3 IMPLANT
TRAY FOLEY MTR SLVR 16FR STAT (SET/KITS/TRAYS/PACK) ×3 IMPLANT
WRAPON POLAR PAD KNEE (MISCELLANEOUS) ×3

## 2017-12-26 NOTE — H&P (Signed)
The patient has been re-examined, and the chart reviewed, and there have been no interval changes to the documented history and physical.    The risks, benefits, and alternatives have been discussed at length. The patient expressed understanding of the risks benefits and agreed with plans for surgical intervention.  James P. Hooten, Jr. M.D.    

## 2017-12-26 NOTE — NC FL2 (Signed)
[place] LEVEL OF CARE SCREENING TOOL     IDENTIFICATION  Patient Name: Benjamin Nicholson Birthdate: 11/24/1950 Sex: male Admission Date (Current Location): 12/26/2017  [place] and [place] Number:  Engineering geologist and Address:  [facility], [address], [place], [place] [address]      Provider Number: [id number]  Attending Physician Name and Address:  Dereck Leep, MD  Relative Name and Phone Number:       Current Level of Care: Hospital Recommended Level of Care: [place] Prior Approval Number:    Date Approved/Denied:   PASRR Number: [id number])  Discharge Plan: SNF    Current Diagnoses: Patient Active Problem List   Diagnosis Date Noted  . LVH (left ventricular hypertrophy) due to hypertensive disease 12/16/2017  . S/P total knee arthroplasty 07/26/2016  . Primary osteoarthritis of left knee 01/09/2016  . Left knee pain 10/13/2015  . Essential hypertension 11/08/2014  . Gastroesophageal reflux disease without esophagitis 11/08/2014  . Hypogonadism in male 11/08/2014  . Mild persistent asthma 11/08/2014  . Sleep disorder 11/08/2014  . Type 2 diabetes mellitus, controlled ([place]) 11/08/2014    Orientation RESPIRATION BLADDER Height & Weight     Self, Time, Situation, Place  Normal Continent Weight: 220 lb 7.4 oz (100 kg) Height:  5\' 11"  (180.3 cm)  BEHAVIORAL SYMPTOMS/MOOD NEUROLOGICAL BOWEL NUTRITION STATUS      Continent Diet(Diet: Clear Liquid to be Advanced. )  AMBULATORY STATUS COMMUNICATION OF NEEDS Skin   Extensive Assist Verbally Surgical wounds(Incision: Right Knee. )                       Personal Care Assistance Level of Assistance  Bathing, Feeding, Dressing Bathing Assistance: Limited assistance Feeding assistance: Independent Dressing Assistance: Limited assistance     Functional Limitations Info  Sight, Hearing, Speech Sight Info: Adequate Hearing Info: Adequate Speech  Info: Adequate    SPECIAL CARE FACTORS FREQUENCY  PT (By licensed PT), OT (By licensed OT)     PT Frequency: (5) OT Frequency: (5)            Contractures      Additional Factors Info  Code Status, Allergies Code Status Info: (Full Code. ) Allergies Info: (Sulfa Antibiotics)           Current Medications (12/26/2017):  This is the current hospital active medication list Current Facility-Administered Medications  Medication Dose Route Frequency Provider Last Rate Last Dose  . 0.9 %  sodium chloride infusion   Intravenous Continuous Hooten, Laurice Record, MD      . acetaminophen (OFIRMEV) IV 1,000 mg  1,000 mg Intravenous Q6H Hooten, Laurice Record, MD      . Derrill Memo ON 12/27/2017] acetaminophen (TYLENOL) tablet 325-650 mg  325-650 mg Oral Q6H PRN Hooten, Laurice Record, MD      . albuterol (PROVENTIL) (2.5 MG/3ML) 0.083% nebulizer solution 3 mL  3 mL Inhalation Q6H PRN Hooten, Laurice Record, MD      . alum & mag hydroxide-simeth (MAALOX/MYLANTA) 200-200-20 MG/5ML suspension 30 mL  30 mL Oral Q4H PRN Hooten, Laurice Record, MD      . amLODipine (NORVASC) tablet 5 mg  5 mg Oral Daily Hooten, Laurice Record, MD      . atorvastatin (LIPITOR) tablet 20 mg  20 mg Oral Daily Hooten, Laurice Record, MD      . bisacodyl (DULCOLAX) suppository 10 mg  10 mg Rectal Daily PRN Hooten, Laurice Record, MD      .  calcium carbonate (TUMS - dosed in mg elemental calcium) chewable tablet 400 mg of elemental calcium  2 tablet Oral Daily PRN Hooten, Laurice Record, MD      . ceFAZolin (ANCEF) 2-4 GM/100ML-% IVPB           . ceFAZolin (ANCEF) IVPB 2g/100 mL premix  2 g Intravenous Q6H Hooten, Laurice Record, MD      . celecoxib (CELEBREX) capsule 200 mg  200 mg Oral BID Hooten, Laurice Record, MD      . diphenhydrAMINE (BENADRYL) 12.5 MG/5ML elixir 12.5-25 mg  12.5-25 mg Oral Q4H PRN Hooten, Laurice Record, MD      . Derrill Memo ON 12/27/2017] enoxaparin (LOVENOX) injection 30 mg  30 mg Subcutaneous Q12H Hooten, Laurice Record, MD      . ferrous sulfate tablet 325 mg  325 mg Oral BID WC Hooten,  Laurice Record, MD      . fluticasone (FLONASE) 50 MCG/ACT nasal spray 1 spray  1 spray Each Nare Daily PRN Hooten, Laurice Record, MD      . furosemide (LASIX) tablet 20 mg  20 mg Oral Daily Hooten, Laurice Record, MD      . gabapentin (NEURONTIN) capsule 300 mg  300 mg Oral QHS Hooten, Laurice Record, MD      . hydrochlorothiazide (HYDRODIURIL) tablet 25 mg  25 mg Oral Daily Hooten, Laurice Record, MD      . HYDROmorphone (DILAUDID) injection 0.5-1 mg  0.5-1 mg Intravenous Q4H PRN Hooten, Laurice Record, MD      . insulin aspart (novoLOG) injection 0-15 Units  0-15 Units Subcutaneous TID WC Hooten, Laurice Record, MD      . losartan (COZAAR) tablet 100 mg  100 mg Oral Daily Hooten, Laurice Record, MD      . magnesium hydroxide (MILK OF MAGNESIA) suspension 30 mL  30 mL Oral Daily Hooten, Laurice Record, MD      . menthol-cetylpyridinium (CEPACOL) lozenge 3 mg  1 lozenge Oral PRN Hooten, Laurice Record, MD       Or  . phenol (CHLORASEPTIC) mouth spray 1 spray  1 spray Mouth/Throat PRN Hooten, Laurice Record, MD      . metFORMIN (GLUCOPHAGE-XR) 24 hr tablet 1,000 mg  1,000 mg Oral BID Hooten, Laurice Record, MD      . metoCLOPramide (REGLAN) tablet 5-10 mg  5-10 mg Oral Q8H PRN Hooten, Laurice Record, MD       Or  . metoCLOPramide (REGLAN) injection 5-10 mg  5-10 mg Intravenous Q8H PRN Hooten, Laurice Record, MD      . metoCLOPramide (REGLAN) tablet 10 mg  10 mg Oral TID AC & HS Hooten, Laurice Record, MD      . mometasone-formoterol (DULERA) 200-5 MCG/ACT inhaler 2 puff  2 puff Inhalation BID Hooten, Laurice Record, MD      . ondansetron (ZOFRAN) tablet 4 mg  4 mg Oral Q6H PRN Hooten, Laurice Record, MD       Or  . ondansetron (ZOFRAN) injection 4 mg  4 mg Intravenous Q6H PRN Hooten, Laurice Record, MD      . oxyCODONE (Oxy IR/ROXICODONE) immediate release tablet 10 mg  10 mg Oral Q4H PRN Hooten, Laurice Record, MD      . oxyCODONE (Oxy IR/ROXICODONE) immediate release tablet 5 mg  5 mg Oral Q4H PRN Hooten, Laurice Record, MD      . pantoprazole (PROTONIX) EC tablet 40 mg  40 mg Oral BID Hooten, Laurice Record, MD      . senna-docusate  (  Senokot-S) tablet 1 tablet  1 tablet Oral BID Hooten, Laurice Record, MD      . sodium phosphate (FLEET) 7-19 GM/118ML enema 1 enema  1 enema Rectal Once PRN Hooten, Laurice Record, MD      . testosterone (ANDROGEL) 50 MG/5GM (1%) gel 2.5 g  2.5 g Transdermal QHS Hooten, Laurice Record, MD      . traMADol Veatrice Bourbon) tablet 50-100 mg  50-100 mg Oral Q4H PRN Hooten, Laurice Record, MD       Facility-Administered Medications Ordered in Other Encounters  Medication Dose Route Frequency Provider Last Rate Last Dose  . acetaminophen (OFIRMEV) IV   Intravenous Anesthesia Intra-op Bernardo Heater, CRNA   1,000 mg at 12/26/17 1337  . bupivacaine (MARCAINE) 0.5 % injection   Other Anesthesia Intra-op Bernardo Heater, CRNA   2.7 mL at 12/26/17 1055  . ceFAZolin (ANCEF) IVPB 2 g/50 mL premix   Intravenous Anesthesia Intra-op Bernardo Heater, CRNA   2 g at 12/26/17 1115  . fentaNYL (SUBLIMAZE) injection    Anesthesia Intra-op Bernardo Heater, CRNA   25 mcg at 12/26/17 1234  . glycopyrrolate (ROBINUL) injection    Anesthesia Intra-op Bernardo Heater, CRNA   0.2 mg at 12/26/17 1113  . midazolam (VERSED) 5 MG/5ML injection    Anesthesia Intra-op Bernardo Heater, CRNA   1 mg at 12/26/17 1050  . phenylephrine (NEO-SYNEPHRINE) 100 mcg/mL in sodium chloride 0.9 % 100 mL infusion   Intravenous Continuous PRN Bernardo Heater, CRNA   Stopped at 12/26/17 1355  . propofol (DIPRIVAN) 10 mg/mL bolus/IV push    Anesthesia Intra-op Bernardo Heater, CRNA   20 mg at 12/26/17 1253  . propofol (DIPRIVAN) 500 MG/50ML infusion    Continuous PRN Bernardo Heater, CRNA   Stopped at 12/26/17 1355  . tetracaine 1 % injection   Intraspinal Anesthesia Intra-op Bernardo Heater, CRNA   3 mg at 12/26/17 1055  . tranexamic acid (CYKLOKAPRON) 1,000 mg in sodium chloride 0.9 % 100 mL IVPB   Intravenous Continuous PRN Bernardo Heater, CRNA   1,000 mg at 12/26/17 1123     Discharge Medications: Please see discharge summary for a list of discharge medications.  Relevant Imaging  Results:  Relevant Lab Results:   Additional Information (SSN: [id number])  Benjamin Nicholson, Veronia Beets, LCSW

## 2017-12-26 NOTE — Transfer of Care (Signed)
Immediate Anesthesia Transfer of Care Note  Patient: Benjamin Nicholson  Procedure(s) Performed: COMPUTER ASSISTED TOTAL KNEE ARTHROPLASTY (Right )  Patient Location: PACU  Anesthesia Type:Spinal  Level of Consciousness: awake, alert , oriented and patient cooperative  Airway & Oxygen Therapy: Patient Spontanous Breathing and Patient connected to nasal cannula oxygen  Post-op Assessment: Report given to RN and Post -op Vital signs reviewed and stable  Post vital signs: Reviewed and stable  Last Vitals:  Vitals Value Taken Time  BP    Temp    Pulse    Resp    SpO2      Last Pain:  Vitals:   12/26/17 0924  PainSc: 0-No pain         Complications: No apparent anesthesia complications

## 2017-12-26 NOTE — Op Note (Signed)
OPERATIVE NOTE  DATE OF SURGERY:  12/26/2017  PATIENT NAME:  Benjamin Nicholson   DOB: 03/19/1951  MRN: [id number]  PRE-OPERATIVE DIAGNOSIS: Degenerative arthrosis of the right knee, primary  POST-OPERATIVE DIAGNOSIS:  Same  PROCEDURE:  Right total knee arthroplasty using computer-assisted navigation  SURGEON:  Marciano Sequin. M.D.  ASSISTANT:  Vance Peper, PA (present and scrubbed throughout the case, critical for assistance with exposure, retraction, instrumentation, and closure)  ANESTHESIA: spinal  ESTIMATED BLOOD LOSS: 50 mL  FLUIDS REPLACED: 1350 mL of crystalloid  TOURNIQUET TIME: 88 minutes  DRAINS: 2 medium Hemovac drains  SOFT TISSUE RELEASES: Anterior cruciate ligament, posterior cruciate ligament, deep medial collateral ligament, patellofemoral ligament  IMPLANTS UTILIZED: DePuy Attune size 5 posterior stabilized femoral component (cemented), size 6 rotating platform tibial component (cemented), 41 mm medialized dome patella (cemented), and a 7 mm stabilized rotating platform polyethylene insert.  INDICATIONS FOR SURGERY: Benjamin Nicholson is a 67 y.o. year old male with a long history of progressive knee pain. X-rays demonstrated severe degenerative changes in tricompartmental fashion. The patient had not seen any significant improvement despite conservative nonsurgical intervention. After discussion of the risks and benefits of surgical intervention, the patient expressed understanding of the risks benefits and agree with plans for total knee arthroplasty.   The risks, benefits, and alternatives were discussed at length including but not limited to the risks of infection, bleeding, nerve injury, stiffness, blood clots, the need for revision surgery, cardiopulmonary complications, among others, and they were willing to proceed.  PROCEDURE IN DETAIL: The patient was brought into the operating room and, after adequate spinal anesthesia was achieved, a tourniquet was placed on the  patient's upper thigh. The patient's knee and leg were cleaned and prepped with alcohol and DuraPrep and draped in the usual sterile fashion. A "timeout" was performed as per usual protocol. The lower extremity was exsanguinated using an Esmarch, and the tourniquet was inflated to 300 mmHg. An anterior longitudinal incision was made followed by a standard mid vastus approach. The deep fibers of the medial collateral ligament were elevated in a subperiosteal fashion off of the medial flare of the tibia so as to maintain a continuous soft tissue sleeve. The patella was subluxed laterally and the patellofemoral ligament was incised. Inspection of the knee demonstrated severe degenerative changes with full-thickness loss of articular cartilage. Osteophytes were debrided using a rongeur. Anterior and posterior cruciate ligaments were excised. Two 4.0 mm Schanz pins were inserted in the femur and into the tibia for attachment of the array of trackers used for computer-assisted navigation. Hip center was identified using a circumduction technique. Distal landmarks were mapped using the computer. The distal femur and proximal tibia were mapped using the computer. The distal femoral cutting guide was positioned using computer-assisted navigation so as to achieve a 5 distal valgus cut. The femur was sized and it was felt that a size 5 femoral component was appropriate. A size 5 femoral cutting guide was positioned and the anterior cut was performed and verified using the computer. This was followed by completion of the posterior and chamfer cuts. Femoral cutting guide for the central box was then positioned in the center box cut was performed.  Attention was then directed to the proximal tibia. Medial and lateral menisci were excised. The extramedullary tibial cutting guide was positioned using computer-assisted navigation so as to achieve a 0 varus-valgus alignment and 3 posterior slope. The cut was performed and  verified using the computer. The proximal tibia  was sized and it was felt that a size 7 tibial tray was appropriate. Tibial and femoral trials were inserted followed by insertion of a 7 mm polyethylene insert. This allowed for excellent mediolateral soft tissue balancing both in flexion and in full extension. Finally, the patella was cut and prepared so as to accommodate a 41 mm medialized dome patella. A patella trial was placed and the knee was placed through a range of motion with excellent patellar tracking appreciated. The femoral trial was removed after debridement of posterior osteophytes. The central post-hole for the tibial component was reamed followed by insertion of a keel punch. Tibial trials were then removed. Cut surfaces of bone were irrigated with copious amounts of normal saline with antibiotic solution using pulsatile lavage and then suctioned dry. Polymethylmethacrylate cement with gentamicin was prepared in the usual fashion using a vacuum mixer. Cement was applied to the cut surface of the proximal tibia as well as along the undersurface of a size 6 rotating platform tibial component. Tibial component was positioned and impacted into place. Excess cement was removed using [employer] fast streamer. Cement was then applied to the cut surfaces of the femur as well as along the posterior flanges of the size 5 femoral component. The femoral component was positioned and impacted into place. Excess cement was removed using [employer] fast streamer. A 7 mm polyethylene trial was inserted and the knee was brought into full extension with steady axial compression applied. Finally, cement was applied to the backside of a 41 mm medialized dome patella and the patellar component was positioned and patellar clamp applied. Excess cement was removed using [employer] fast streamer. After adequate curing of the cement, the tourniquet was deflated after a total tourniquet time of 88 minutes. Hemostasis was achieved using electrocautery.  The knee was irrigated with copious amounts of normal saline with antibiotic solution using pulsatile lavage and then suctioned dry. 20 mL of 1.3% Exparel and 60 mL of 0.25% Marcaine in 40 mL of normal saline was injected along the posterior capsule, medial and lateral gutters, and along the arthrotomy site. A 7 mm stabilized rotating platform polyethylene insert was inserted and the knee was placed through a range of motion with excellent mediolateral soft tissue balancing appreciated and excellent patellar tracking noted. 2 medium drains were placed in the wound bed and brought out through separate stab incisions. The medial parapatellar portion of the incision was reapproximated using interrupted sutures of #1 Vicryl. Subcutaneous tissue was approximated in layers using first #0 Vicryl followed #2-0 Vicryl. The skin was approximated with skin staples. A sterile dressing was applied.  The patient tolerated the procedure well and was transported to the recovery room in stable condition.    James P. Holley Bouche., M.D.

## 2017-12-26 NOTE — Anesthesia Preprocedure Evaluation (Signed)
Anesthesia Evaluation  Patient identified by MRN, date of birth, ID band Patient awake    Reviewed: Allergy & Precautions, NPO status , Patient's Chart, lab work & pertinent test results  History of Anesthesia Complications Negative for: history of anesthetic complications  Airway Mallampati: II       Dental  (+) Dental Advidsory Given   Pulmonary neg shortness of breath, asthma , neg sleep apnea, Residual Coughneg recent URI,           Cardiovascular Exercise Tolerance: Good hypertension, Pt. on medications (-) angina(-) CAD, (-) Past MI, (-) Cardiac Stents and (-) CABG (-) dysrhythmias (-) Valvular Problems/Murmurs     Neuro/Psych negative neurological ROS  negative psych ROS   GI/Hepatic Neg liver ROS, GERD  Medicated and Poorly Controlled,  Endo/Other  diabetes, Oral Hypoglycemic Agents  Renal/GU negative Renal ROS     Musculoskeletal  (+) Arthritis ,   Abdominal   Peds  Hematology negative hematology ROS (+)   Anesthesia Other Findings Past Medical History: No date: Asthma No date: Diabetes mellitus without complication (HCC) No date: Diverticulosis No date: Dyspnea No date: Environmental and seasonal allergies No date: GERD (gastroesophageal reflux disease) No date: Hypertension No date: Hypogonadism in male No date: Lymph edema     Comment:  primarily left / slight on right No date: Osteoarthritis No date: Prepatellar bursitis No date: Sleep apnea     Comment:  no cpap/ no sleep study   Reproductive/Obstetrics                             Anesthesia Physical  Anesthesia Plan  ASA: III  Anesthesia Plan: Spinal   Post-op Pain Management:    Induction:   PONV Risk Score and Plan: 1 and Propofol infusion and TIVA  Airway Management Planned: Simple Face Mask and Natural Airway  Additional Equipment:   Intra-op Plan:   Post-operative Plan:   Informed Consent: I  have reviewed the patients History and Physical, chart, labs and discussed the procedure including the risks, benefits and alternatives for the proposed anesthesia with the patient or authorized representative who has indicated his/her understanding and acceptance.     Plan Discussed with:   Anesthesia Plan Comments:         Anesthesia Quick Evaluation

## 2017-12-26 NOTE — Anesthesia Procedure Notes (Signed)
Spinal  Patient location during procedure: OR Start time: 12/26/2017 10:50 AM End time: 12/26/2017 10:55 AM Staffing Resident/CRNA: Bernardo Heater, CRNA Performed: resident/CRNA  Preanesthetic Checklist Completed: patient identified, site marked, surgical consent, pre-op evaluation, timeout performed, IV checked, risks and benefits discussed and monitors and equipment checked Spinal Block Patient position: sitting Prep: ChloraPrep Patient monitoring: heart rate, continuous pulse ox, blood pressure and cardiac monitor Approach: midline Location: L3-4 Injection technique: single-shot Needle Needle type: Introducer and Pencan  Needle gauge: 24 G Needle length: 9 cm Additional Notes Negative paresthesia. Negative blood return. Positive free-flowing CSF. Expiration date of kit checked and confirmed. Patient tolerated procedure well, without complications.

## 2017-12-26 NOTE — Anesthesia Post-op Follow-up Note (Signed)
Anesthesia QCDR form completed.        

## 2017-12-26 NOTE — Progress Notes (Signed)
Patient was admitted to room 156 from OR. Post-op dressing to right knee, hemovac, polar care, and foley in place. A&O x4. Wife are bedside. Reviewed POC and orders. Allowed time for questions.

## 2017-12-27 ENCOUNTER — Encounter: Payer: Self-pay | Admitting: Orthopedic Surgery

## 2017-12-27 LAB — GLUCOSE, CAPILLARY
GLUCOSE-CAPILLARY: 140 mg/dL — AB (ref 70–99)
Glucose-Capillary: 141 mg/dL — ABNORMAL HIGH (ref 70–99)
Glucose-Capillary: 139 mg/dL — ABNORMAL HIGH (ref 70–99)
Glucose-Capillary: 112 mg/dL — ABNORMAL HIGH (ref 70–99)

## 2017-12-27 MED ORDER — ENOXAPARIN SODIUM 40 MG/0.4ML ~~LOC~~ SOLN: 40.0000 mg | SUBCUTANEOUS | 0 refills | Status: AC

## 2017-12-27 MED ORDER — OXYCODONE HCL 5 MG PO TABS: 5.0000 mg | ORAL_TABLET | ORAL | 0 refills | Status: AC | PRN

## 2017-12-27 MED ORDER — TRAMADOL HCL 50 MG PO TABS: 50.0000 mg | ORAL_TABLET | ORAL | 0 refills | Status: AC | PRN

## 2017-12-27 NOTE — Progress Notes (Signed)
Physical Therapy Treatment Patient Details Name: Benjamin Nicholson MRN: [id number] DOB: 09/22/1950 Today's Date: 12/27/2017    History of Present Illness Pt is a 67 yo male diagnosed with degenerative arthrosis of the right knee and is s/p elective R TKA.  PMH includes HTN, GERD, DM, dyspnea, OA, lymphedema, and LVH.    PT Comments    Pt presents with min deficits in strength, transfers, R knee ROM, gait, and activity tolerance.  Pt presented with min dizziness upon initial stand after performing some standing therex and was returned to sitting.  BP taken at 119/85 mmHg with symptoms resolving quickly upon sitting, nursing notified.  Pt performed seated therex and then returned to standing with only very mild symptoms of dizziness with pt stating feeling ok to ambulate to the restroom for toileting but with further amb/stair training deferred this session.  Pt will benefit from HHPT services upon discharge to safely address above deficits for decreased caregiver assistance and eventual return to PLOF.     Follow Up Recommendations  Home health PT     Equipment Recommendations  None recommended by PT    Recommendations for Other Services       Precautions / Restrictions Precautions Precautions: Fall;Knee Required Braces or Orthoses: Other Brace/Splint Other Brace/Splint: Pt able to perform 10 independent RLE SLRs without extensor lag, no KI required Restrictions Weight Bearing Restrictions: Yes RLE Weight Bearing: Weight bearing as tolerated    Mobility  Bed Mobility Overal bed mobility: Independent             General bed mobility comments: NT, pt in recliner  Transfers Overall transfer level: Needs assistance Equipment used: Rolling walker (2 wheeled) Transfers: Sit to/from Stand Sit to Stand: Min guard         General transfer comment: Min verbal cues for sequencing with good control and stability  Ambulation/Gait Ambulation/Gait assistance: Min guard Gait  Distance (Feet): 30 Feet Assistive device: Rolling walker (2 wheeled) Gait Pattern/deviations: Step-through pattern;Antalgic;Trunk flexed;Decreased stance time - right Gait velocity: Reduced   General Gait Details: Pt reported min dizziness in standing initially, returned to sitting with BP taken in sitting at 119/85 mmHg; attempted standing again with pt asymtomatic, nursing notified   Stairs Stairs: (Deferred)           Wheelchair Mobility    Modified Rankin (Stroke Patients Only)       Balance Overall balance assessment: No apparent balance deficits (not formally assessed)                                          Cognition Arousal/Alertness: Awake/alert Behavior During Therapy: WFL for tasks assessed/performed Overall Cognitive Status: Within Functional Limits for tasks assessed                                        Exercises Total Joint Exercises Ankle Circles/Pumps: AROM;Both;10 reps Quad Sets: AROM;Strengthening;Right;Other reps (comment) Straight Leg Raises: AROM;10 reps;Right Long Arc Quad: AROM;Right;Other reps (comment)(2 x 10 reps) Knee Flexion: AROM;Right;Other reps (comment)(2 x 10 reps) Goniometric ROM: R knee AROM: 0-98 deg Marching in Standing: AROM;Both;10 reps;15 reps;Standing;Seated Other Exercises Other Exercises: HEP education/review Other Exercises: HEP packet provided    General Comments        Pertinent Vitals/Pain Pain Assessment: No/denies pain Pain Score:  1  Pain Location: R knee Pain Descriptors / Indicators: Aching;Sore Pain Intervention(s): Monitored during session;Premedicated before session    [place] expects to be discharged to:: Private residence Living Arrangements: Spouse/significant other;Children Available Help at Discharge: Family;Available 24 hours/day Type of Home: House Home Access: Level entry   Home Layout: Two level;Able to live on main level with  bedroom/bathroom Home Equipment: Gilford Rile - 2 wheels;Bedside commode      Prior Function Level of Independence: Independent      Comments: Ind amb without an AD community distances, Ind with ADLs, no fall history   PT Goals (current goals can now be found in the care plan section) Acute Rehab PT Goals Patient Stated Goal: Improved balance PT Goal Formulation: With patient Time For Goal Achievement: 01/09/18 Potential to Achieve Goals: Good    Frequency    BID      PT Plan Current plan remains appropriate    Co-evaluation              AM-PAC PT "6 Clicks" Daily Activity  Outcome Measure  Difficulty turning over in bed (including adjusting bedclothes, sheets and blankets)?: None Difficulty moving from lying on back to sitting on the side of the bed? : None Difficulty sitting down on and standing up from a chair with arms (e.g., wheelchair, bedside commode, etc,.)?: Unable Help needed moving to and from a bed to chair (including a wheelchair)?: A Little Help needed walking in hospital room?: A Little Help needed climbing 3-5 steps with a railing? : A Little 6 Click Score: 18    End of Session Equipment Utilized During Treatment: Gait belt Activity Tolerance: Patient tolerated treatment well Patient left: Other (comment)(In restroom, nursing notified) Nurse Communication: Mobility status PT Visit Diagnosis: Muscle weakness (generalized) (M62.81);Other abnormalities of gait and mobility (R26.89);Pain Pain - Right/Left: Right Pain - part of body: Knee     Time: 1320-1346 PT Time Calculation (min) (ACUTE ONLY): 26 min  Charges:  $Therapeutic Exercise: 8-22 mins $Therapeutic Activity: 8-22 mins                     D. Scott Kindred Heying PT, DPT 12/27/17, 2:16 PM

## 2017-12-27 NOTE — Anesthesia Postprocedure Evaluation (Signed)
Anesthesia Post Note  Patient: Benjamin Nicholson  Procedure(s) Performed: COMPUTER ASSISTED TOTAL KNEE ARTHROPLASTY (Right )  Patient location during evaluation: Nursing Unit Anesthesia Type: Spinal Level of consciousness: awake, awake and alert and oriented Pain management: pain level controlled Vital Signs Assessment: post-procedure vital signs reviewed and stable Respiratory status: spontaneous breathing, nonlabored ventilation and respiratory function stable Cardiovascular status: blood pressure returned to baseline and stable Postop Assessment: no headache and no backache Anesthetic complications: no     Last Vitals:  Vitals:   12/26/17 2318 12/27/17 0820  BP: (!) 144/97 (!) 132/94  Pulse: 75 64  Resp: 19 18  Temp: 36.4 C 36.7 C  SpO2: 95% 99%    Last Pain:  Vitals:   12/27/17 0820  TempSrc: Oral  PainSc:                  Johnna Acosta

## 2017-12-27 NOTE — Discharge Summary (Signed)
Physician Discharge Summary  Patient ID: Benjamin Nicholson MRN: [id number] DOB/AGE: 65-Apr-1952 42 y.o.  Admit date: 12/26/2017 Discharge date: 12/28/2017  Admission Diagnoses:  PRIMARY OSTEOARTHRITIS OF RIGHT KNEE   Discharge Diagnoses: Patient Active Problem List   Diagnosis Date Noted  . LVH (left ventricular hypertrophy) due to hypertensive disease 12/16/2017  . S/P total knee arthroplasty 07/26/2016  . Primary osteoarthritis of left knee 01/09/2016  . Left knee pain 10/13/2015  . Essential hypertension 11/08/2014  . Gastroesophageal reflux disease without esophagitis 11/08/2014  . Hypogonadism in male 11/08/2014  . Mild persistent asthma 11/08/2014  . Sleep disorder 11/08/2014  . Type 2 diabetes mellitus, controlled ([place]) 11/08/2014    Past Medical History:  Diagnosis Date  . Asthma   . Diabetes mellitus without complication ([place])   . Diverticulosis   . Dyspnea   . Environmental and seasonal allergies   . GERD (gastroesophageal reflux disease)   . Hypertension   . Hypogonadism in male   . Lymph edema    primarily left / slight on right  . Osteoarthritis   . Prepatellar bursitis   . Sleep apnea    no cpap/ no sleep study     Transfusion: No transfusions during this admission   Consultants (if any):   Discharged Condition: Improved  Hospital Course: Benjamin Nicholson is an 67 y.o. male who was admitted 12/26/2017 with a diagnosis of degenerative arthrosis right knee and went to the operating room on 12/26/2017 and underwent the above named procedures.    Surgeries:Procedure(s): COMPUTER ASSISTED TOTAL KNEE ARTHROPLASTY on 12/26/2017  PRE-OPERATIVE DIAGNOSIS: Degenerative arthrosis of the right knee, primary  POST-OPERATIVE DIAGNOSIS:  Same  PROCEDURE:  Right total knee arthroplasty using computer-assisted navigation  SURGEON:  Marciano Sequin. M.D.  ASSISTANT:  Vance Peper, PA (present and scrubbed throughout the case, critical for assistance with exposure,  retraction, instrumentation, and closure)  ANESTHESIA: spinal  ESTIMATED BLOOD LOSS: 50 mL  FLUIDS REPLACED: 1350 mL of crystalloid  TOURNIQUET TIME: 88 minutes  DRAINS: 2 medium Hemovac drains  SOFT TISSUE RELEASES: Anterior cruciate ligament, posterior cruciate ligament, deep medial collateral ligament, patellofemoral ligament  IMPLANTS UTILIZED: DePuy Attune size 5 posterior stabilized femoral component (cemented), size 6 rotating platform tibial component (cemented), 41 mm medialized dome patella (cemented), and a 7 mm stabilized rotating platform polyethylene insert.  INDICATIONS FOR SURGERY: Benjamin Nicholson is a 67 y.o. year old male with a long history of progressive knee pain. X-rays demonstrated severe degenerative changes in tricompartmental fashion. The patient had not seen any significant improvement despite conservative nonsurgical intervention. After discussion of the risks and benefits of surgical intervention, the patient expressed understanding of the risks benefits and agree with plans for total knee arthroplasty.   The risks, benefits, and alternatives were discussed at length including but not limited to the risks of infection, bleeding, nerve injury, stiffness, blood clots, the need for revision surgery, cardiopulmonary complications, among others, and they were willing to proceed. Patient tolerated the surgery well. No complications .Patient was taken to PACU where she was stabilized and then transferred to the orthopedic floor.  Patient started on Lovenox 30 mg q 12 hrs. Foot pumps applied bilaterally at 80 mm hgb. Heels elevated off bed with rolled towels. No evidence of DVT. Calves non tender. Negative Homan. Physical therapy started on day #1 for gait training and transfer with OT starting on  day #1 for ADL and assisted devices. Patient has done well with therapy. Ambulated greater than  200 feet upon being discharged.  Able to ascend and descend 4 steps safely  and independently  Patient's IV And Foley were discontinued on day #1 with Hemovac being discontinued on day #2. Dressing was changed on day 2 prior to patient being discharged   He was given perioperative antibiotics:  Anti-infectives (From admission, onward)   Start     Dose/Rate Route Frequency Ordered Stop   12/26/17 1800  ceFAZolin (ANCEF) IVPB 2g/100 mL premix     2 g 200 mL/hr over 30 Minutes Intravenous Every 6 hours 12/26/17 1542 12/27/17 1759   12/26/17 0858  ceFAZolin (ANCEF) 2-4 GM/100ML-% IVPB    Note to Pharmacy:  Garfield Cornea   : cabinet override      12/26/17 0858 12/26/17 2114   12/26/17 0600  ceFAZolin (ANCEF) IVPB 2g/100 mL premix  Status:  Discontinued     2 g 200 mL/hr over 30 Minutes Intravenous On call to O.R. 12/25/17 2157 12/26/17 0902    .  He was fitted with AV 1 compression foot pump devices, instructed on heel pumps, early ambulation, and fitted with TED stockings bilaterally for DVT prophylaxis.  He benefited maximally from the hospital stay and there were no complications.    Recent vital signs:  Vitals:   12/26/17 2010 12/26/17 2318  BP: (!) 150/98 (!) 144/97  Pulse: 74 75  Resp: 19 19  Temp: (!) 97.5 F (36.4 C) 97.6 F (36.4 C)  SpO2: 97% 95%    Recent laboratory studies:  Lab Results  Component Value Date   HGB 13.5 12/13/2017   HGB 12.1 (L) 07/28/2016   HGB 13.0 07/27/2016   Lab Results  Component Value Date   WBC 5.2 12/13/2017   PLT 274 12/13/2017   Lab Results  Component Value Date   INR 0.90 12/13/2017   Lab Results  Component Value Date   NA 137 12/13/2017   K 4.0 12/13/2017   CL 101 12/13/2017   CO2 22 12/13/2017   BUN 19 12/13/2017   CREATININE 0.76 12/13/2017   GLUCOSE 126 (H) 12/13/2017    Discharge Medications:   Allergies as of 12/27/2017      Reactions   Sulfa Antibiotics Rash   'flu-like' symptoms      Medication List    STOP taking these medications   aspirin EC 81 MG tablet     TAKE these  medications   albuterol 108 (90 Base) MCG/ACT inhaler Commonly known as:  PROVENTIL HFA;VENTOLIN HFA Inhale 2 puffs into the lungs every 6 (six) hours as needed for wheezing or shortness of breath.   amLODipine 5 MG tablet Commonly known as:  NORVASC Take 5 mg by mouth daily.   atorvastatin 20 MG tablet Commonly known as:  LIPITOR Take 20 mg by mouth daily.   budesonide-formoterol 160-4.5 MCG/ACT inhaler Commonly known as:  SYMBICORT Inhale 2 puffs into the lungs 2 (two) times daily.   calcium carbonate 500 MG chewable tablet Commonly known as:  TUMS - dosed in mg elemental calcium Chew 2 tablets by mouth daily as needed for indigestion or heartburn.   enoxaparin 40 MG/0.4ML injection Commonly known as:  LOVENOX Inject 0.4 mLs (40 mg total) into the skin daily for 14 days. Start taking on:  12/29/2017   fluticasone 50 MCG/ACT nasal spray Commonly known as:  FLONASE Place 1 spray into both nostrils daily as needed for allergies or rhinitis.   furosemide 20 MG tablet Commonly known as:  LASIX Take 1 tablet by  mouth daily.   hydrochlorothiazide 25 MG tablet Commonly known as:  HYDRODIURIL Take 25 mg by mouth daily.   losartan 100 MG tablet Commonly known as:  COZAAR Take 100 mg by mouth daily.   metFORMIN 500 MG 24 hr tablet Commonly known as:  GLUCOPHAGE-XR Take 1,000 mg by mouth 2 (two) times daily.   omeprazole 20 MG capsule Commonly known as:  PRILOSEC Take 20 mg by mouth daily as needed (heartburn).   oxyCODONE 5 MG immediate release tablet Commonly known as:  Oxy IR/ROXICODONE Take 1 tablet (5 mg total) by mouth every 4 (four) hours as needed for moderate pain (pain score 4-6).   testosterone 50 MG/5GM (1%) Gel Commonly known as:  ANDROGEL Place 2.5 g onto the skin at bedtime.   traMADol 50 MG tablet Commonly known as:  ULTRAM Take 1-2 tablets (50-100 mg total) by mouth every 4 (four) hours as needed for moderate pain.   VIAGRA 50 MG tablet Generic  drug:  sildenafil Take 50 mg by mouth as needed for erectile dysfunction.            Durable Medical Equipment  (From admission, onward)         Start     Ordered   12/26/17 1543  DME Walker rolling  Once    Question:  Patient needs a walker to treat with the following condition  Answer:  Total knee replacement status   12/26/17 1542   12/26/17 1543  DME Bedside commode  Once    Question:  Patient needs a bedside commode to treat with the following condition  Answer:  Total knee replacement status   12/26/17 1542          Diagnostic Studies: Dg Knee Right Port  Result Date: 12/26/2017 CLINICAL DATA:  67 year old male post knee replacement. Initial encounter. EXAM: PORTABLE RIGHT KNEE - 1-2 VIEW COMPARISON:  10/30/2009 MR. FINDINGS: Post total right knee replacement which appears in satisfactory position without complication noted. Pin tracks probably from prior external fixation device distal femur proximal tibia. Drain in place. IMPRESSION: Post total right knee replacement. Electronically Signed   By: Genia Del M.D.   On: 12/26/2017 15:10    Disposition:   Discharge Instructions    Diet - low sodium heart healthy   Complete by:  As directed    Increase activity slowly   Complete by:  As directed       Follow-up Information    Watt Climes, PA On 01/10/2018.   Specialty:  Physician Assistant Why:  at 1:15pm Contact information: [place] [address] [phone]        Dereck Leep, MD On 02/09/2018.   Specialty:  Orthopedic Surgery Why:  at 1:45pm Contact information: [place] [address] [phone]            Signed: Watt Climes. 12/27/2017, 7:57 AM

## 2017-12-27 NOTE — Progress Notes (Signed)
ORTHOPAEDICS PROGRESS NOTE  PATIENT NAME: Benjamin Nicholson DOB: Jan 18, 1951  MRN: [id number]  POD # 1: Right total knee arthroplasty  Subjective: The patient states the pain has been under good control. He denies any nausea or vomiting.  He sat at the bedside with nursing.  Objective: Vital signs in last 24 hours: Temp:  [97.5 F (36.4 C)-97.9 F (36.6 C)] 97.6 F (36.4 C) (09/30 2318) Pulse Rate:  [70-86] 75 (09/30 2318) Resp:  [12-21] 19 (09/30 2318) BP: (129-162)/(85-98) 144/97 (09/30 2318) SpO2:  [93 %-100 %] 95 % (09/30 2318) Weight:  [100 kg] 100 kg (09/30 1531)  Intake/Output from previous day: 09/30 0701 - 10/01 0700 In: 4429 [P.O.:360; I.V.:2585; IV Piggyback:1484] Out: 2975 [Urine:2775; Drains:150; Blood:50]  No results for input(s): WBC, HGB, HCT, PLT, K, CL, CO2, BUN, CREATININE, GLUCOSE, CALCIUM, LABPT, INR in the last 72 hours.  EXAM General: Well-developed well-nourished male seen in no apparent discomfort. Lungs: clear to auscultation Cardiac: normal rate and regular rhythm Right lower extremity: Hemovac drain and Polar Care are in place and functioning.  Bone foam is in place.  The patient is able to perform an independent straight leg raise.  Homans test is negative. Neurologic: Awake, alert, and oriented.  Sensory and motor function are intact.  Assessment: Right total knee arthroplasty  Secondary diagnoses: Sleep apnea Lymphedema Hypertension Gastroesophageal reflux disease Diverticulosis Diabetes Asthma  Plan: Today's goals were reviewed with the patient.  Physical therapy and Occupational Therapy as per total knee arthroplasty rehab protocol. Plan is to go Home after hospital stay. DVT Prophylaxis - Lovenox, Foot Pumps and TED hose  James P. Holley Bouche M.D.

## 2017-12-27 NOTE — Evaluation (Signed)
Physical Therapy Evaluation Patient Details Name: Benjamin Nicholson MRN: [id number] DOB: Sep 21, 1950 Today's Date: 12/27/2017   History of Present Illness  Pt is a 67 yo male diagnosed with degenerative arthrosis of the right knee and is s/p elective R TKA.  PMH includes HTN, GERD, DM, dyspnea, OA, lymphedema, and LVH.    Clinical Impression  Pt presents with mild deficits in strength, transfers, gait, R knee ROM, and activity tolerance but overall performed very well during this session.  Pt was Ind with bed mobility tasks and CGA with transfers with good control and stability.  Pt was able to amb completely around the nursing station with a RW and CGA with a mildly antalgic gait pattern including decreased RLE stance time and reduced cadence.  Pt's R knee AROM was 0-98 deg with only mid pain at end range.  Pt should progress very well while in acute care and will benefit from HHPT services upon discharge to safely address above deficits for decreased caregiver assistance and eventual return to PLOF.        Follow Up Recommendations Home health PT    Equipment Recommendations  None recommended by PT    Recommendations for Other Services       Precautions / Restrictions Precautions Precautions: Fall;Knee Required Braces or Orthoses: Other Brace/Splint Other Brace/Splint: Pt able to perform 10 independent RLE SLRs without extensor lag, no KI required Restrictions Weight Bearing Restrictions: Yes RLE Weight Bearing: Weight bearing as tolerated LLE Weight Bearing: Weight bearing as tolerated      Mobility  Bed Mobility Overal bed mobility: Independent             General bed mobility comments: Good speed and control with bed mobility tasks  Transfers Overall transfer level: Needs assistance Equipment used: Rolling walker (2 wheeled) Transfers: Sit to/from Stand Sit to Stand: Min guard         General transfer comment: Min verbal cues for sequencing with good control and  stability  Ambulation/Gait Ambulation/Gait assistance: Min guard Gait Distance (Feet): 200 Feet Assistive device: Rolling walker (2 wheeled) Gait Pattern/deviations: Step-through pattern;Antalgic;Trunk flexed;Decreased stance time - right Gait velocity: Reduced   General Gait Details: Mildly antalgic gait on the RLE with decreased RLE stance time, increased WB through the BUEs, and reduced cadence but steady without LOB  Stairs Stairs: (Deferred)          Wheelchair Mobility    Modified Rankin (Stroke Patients Only)       Balance Overall balance assessment: No apparent balance deficits (not formally assessed)                                           Pertinent Vitals/Pain Pain Assessment: 0-10 Pain Score: 1  Pain Location: R knee Pain Descriptors / Indicators: Aching;Sore Pain Intervention(s): Premedicated before session;Monitored during session    [place] expects to be discharged to:: Private residence Living Arrangements: Spouse/significant other;Children Available Help at Discharge: Family;Available 24 hours/day Type of Home: House Home Access: Level entry     Home Layout: Two level;Able to live on main level with bedroom/bathroom Home Equipment: Gilford Rile - 2 wheels;Bedside commode      Prior Function Level of Independence: Independent         Comments: Ind amb without an AD community distances, Ind with ADLs, no fall history     Hand Dominance  Dominant Hand: Right    Extremity/Trunk Assessment        Lower Extremity Assessment Lower Extremity Assessment: Generalized weakness;RLE deficits/detail RLE Deficits / Details: Pt able to perform independent RLE SLRs without extensor lag, R knee strength NT RLE: Unable to fully assess due to pain RLE Sensation: WNL       Communication   Communication: No difficulties  Cognition Arousal/Alertness: Awake/alert Behavior During Therapy: WFL for tasks  assessed/performed Overall Cognitive Status: Within Functional Limits for tasks assessed                                        General Comments      Exercises Total Joint Exercises Ankle Circles/Pumps: AROM;Both;10 reps Quad Sets: AROM;Strengthening;Right;Other reps (comment)(2 x 10 reps) Straight Leg Raises: AROM;10 reps;Right Long Arc Quad: AROM;Right;Other reps (comment)(2 x 10 reps) Knee Flexion: AROM;Right;Other reps (comment)(2 x 10 reps) Goniometric ROM: R knee AROM: 0-98 deg Marching in Standing: AROM;Both;10 reps Other Exercises Other Exercises: Positioning education and HEP education for R knee flex/ext ROM Other Exercises: HEP packet provided   Assessment/Plan    PT Assessment Patient needs continued PT services  PT Problem List Decreased strength;Decreased range of motion;Decreased activity tolerance;Decreased balance       PT Treatment Interventions DME instruction;Gait training;Stair training;Functional mobility training;Balance training;Therapeutic exercise;Therapeutic activities;Patient/family education    PT Goals (Current goals can be found in the Care Plan section)  Acute Rehab PT Goals Patient Stated Goal: Improved balance PT Goal Formulation: With patient Time For Goal Achievement: 01/09/18 Potential to Achieve Goals: Good    Frequency BID   Barriers to discharge        Co-evaluation               AM-PAC PT "6 Clicks" Daily Activity  Outcome Measure Difficulty turning over in bed (including adjusting bedclothes, sheets and blankets)?: None Difficulty moving from lying on back to sitting on the side of the bed? : None Difficulty sitting down on and standing up from a chair with arms (e.g., wheelchair, bedside commode, etc,.)?: Unable Help needed moving to and from a bed to chair (including a wheelchair)?: A Little Help needed walking in hospital room?: A Little Help needed climbing 3-5 steps with a railing? : A Little 6  Click Score: 18    End of Session Equipment Utilized During Treatment: Gait belt Activity Tolerance: Patient tolerated treatment well Patient left: in chair;with call bell/phone within reach;with chair alarm set;with SCD's reapplied;Other (comment)(Polar care to R knee) Nurse Communication: Mobility status PT Visit Diagnosis: Muscle weakness (generalized) (M62.81);Other abnormalities of gait and mobility (R26.89);Pain Pain - Right/Left: Right Pain - part of body: Knee    Time: 9562-1308 PT Time Calculation (min) (ACUTE ONLY): 46 min   Charges:   PT Evaluation $PT Eval Low Complexity: 1 Low PT Treatments $Therapeutic Exercise: 8-22 mins        D. Scott Dorothee Napierkowski PT, DPT 12/27/17, 11:16 AM

## 2017-12-27 NOTE — Progress Notes (Signed)
OT Cancellation Note  Patient Details Name: Benjamin Nicholson MRN: [id number] DOB: 04-25-1950   Cancelled Treatment:    Reason Eval/Treat Not Completed: OT screened, no needs identified, will sign off.  Order for OT evaluation received and chart reviewed.  Met with patient and no OT needs identified at this time.  Screening only completed.  Please re-consult if needs change.  Chrys Racer, OTR/L ascom [phone] 12/27/17, 11:13 AM

## 2017-12-27 NOTE — Progress Notes (Signed)
Clinical Social Worker (CSW) received SNF consult. PT is recommending home health. RN case manager aware of above. Please reconsult if future social work needs arise. CSW signing off.   Bular Hickok, LCSW [phone] 

## 2017-12-28 LAB — GLUCOSE, CAPILLARY
GLUCOSE-CAPILLARY: 130 mg/dL — AB (ref 70–99)
Glucose-Capillary: 111 mg/dL — ABNORMAL HIGH (ref 70–99)

## 2017-12-28 MED ORDER — INFLUENZA VAC SPLIT HIGH-DOSE 0.5 ML IM SUSY
0.5000 mL | PREFILLED_SYRINGE | INTRAMUSCULAR | Status: AC
  Administered 2017-12-28: 0.5 mL via INTRAMUSCULAR
  Filled 2017-12-28: qty 0.5

## 2017-12-28 NOTE — Progress Notes (Signed)
Patient was discharged home with wife. Reviewed discharge orders and instructions. Last doses reviewed. Changed dressing and TED placed. IV removed with cath intact. Allowed time for questions.

## 2017-12-28 NOTE — Progress Notes (Signed)
   Subjective: 2 Days Post-Op Procedure(s) (LRB): COMPUTER ASSISTED TOTAL KNEE ARTHROPLASTY (Right) Patient reports pain as moderate.  Was noted to have significant increased pain after the Exparel wore off yesterday. Having some problems this morning doing straight leg raises.  Has good muscle tone. Patient is well, and has had no acute complaints or problems Patient did well with physical therapy yesterday.  Was able to ambulate around the nurses desk and did 4 steps..  Plan is to go Home after hospital stay. no nausea and no vomiting Patient denies any chest pains or shortness of breath. Objective: Vital signs in last 24 hours: Temp:  [97.8 F (36.6 C)-98.4 F (36.9 C)] 97.8 F (36.6 C) (10/02 0300) Pulse Rate:  [64-80] 75 (10/02 0300) Resp:  [17-20] 17 (10/02 0300) BP: (119-146)/(79-94) 140/92 (10/02 0300) SpO2:  [95 %-100 %] 97 % (10/02 0300) well approximated incision Heels are non tender and elevated off the bed using rolled towels Intake/Output from previous day: 10/01 0701 - 10/02 0700 In: 480 [P.O.:480] Out: 1875 [Urine:1875] Intake/Output this shift: No intake/output data recorded.  No results for input(s): HGB in the last 72 hours. No results for input(s): WBC, RBC, HCT, PLT in the last 72 hours. No results for input(s): NA, K, CL, CO2, BUN, CREATININE, GLUCOSE, CALCIUM in the last 72 hours. No results for input(s): LABPT, INR in the last 72 hours.  EXAM General - Patient is Alert, Appropriate and Oriented Extremity - Neurologically intact Neurovascular intact Sensation intact distally Intact pulses distally No cellulitis present Compartment soft Dressing - dressing C/D/I Motor Function - intact, moving foot and toes well on exam.    Past Medical History:  Diagnosis Date  . Asthma   . Diabetes mellitus without complication ([place])   . Diverticulosis   . Dyspnea   . Environmental and seasonal allergies   . GERD (gastroesophageal reflux disease)   .  Hypertension   . Hypogonadism in male   . Lymph edema    primarily left / slight on right  . Osteoarthritis   . Prepatellar bursitis   . Sleep apnea    no cpap/ no sleep study    Assessment/Plan: 2 Days Post-Op Procedure(s) (LRB): COMPUTER ASSISTED TOTAL KNEE ARTHROPLASTY (Right) Active Problems:   S/P total knee arthroplasty  Estimated body mass index is 30.75 kg/m as calculated from the following:   Height as of this encounter: 5\' 11"  (1.803 m).   Weight as of this encounter: 100 kg. Up with therapy Discharge home with home health  Labs: None DVT Prophylaxis - Lovenox, Foot Pumps and TED hose Weight-Bearing as tolerated to right leg Hemovac was discontinued today.  No resistance noted.  Into the drain appeared to be intact. Please wash operative leg, change dressing and apply new dressing prior to being discharge Please give the patient 2 extra honeycomb dressings to take home Be sure bone foam goes home with patient.  Jillyn Ledger. [employer] 12/28/2017, 7:07 AM

## 2017-12-28 NOTE — Care Management Note (Signed)
Case Management Note  Patient Details  Name: Benjamin Nicholson MRN: [id number] Date of Birth: [birth year]  Subjective/Objective:  POD #  2 right TKA. Met with patient at bedside to discuss transtion of care.  Patient will discharge home with his wife. She has picked up his enoxaparin. He has a walker and bsc. He prefers to use [facility]. Referral sent to [facility] for HHPT. No further needs identified.              Action/Plan:   Expected Discharge Date:  12/28/17               Expected Discharge Plan:  [place]  In-House Referral:     Discharge planning Services  CM Consult  Post Acute Care Choice:  Home Health Choice offered to:  Patient  DME Arranged:    DME Agency:     HH Arranged:  PT [place]:  [facility] (formerly [facility])  Status of Service:  Completed, signed off  If discussed at [employer], dates discussed:    Additional Comments:  Jolly Mango, RN 12/28/2017, 10:19 AM

## 2017-12-28 NOTE — Progress Notes (Signed)
Physical Therapy Treatment Patient Details Name: Benjamin Nicholson MRN: [id number] DOB: May 19, 1950 Today's Date: 12/28/2017    History of Present Illness Pt is a 67 yo male diagnosed with degenerative arthrosis of the right knee and is s/p elective R TKA.  PMH includes HTN, GERD, DM, dyspnea, OA, lymphedema, and LVH.    PT Comments    Mr. Haymond made good progress with mobility, completing stair training this session.  He ambulated 80 ft with RW (limited by 7/10 pain in R knee). Pt was able to complete SLR x5 without assist so KI was not utilized. Pt will benefit from continued skilled PT services to increase functional independence and safety.    Follow Up Recommendations  Home health PT     Equipment Recommendations  None recommended by PT    Recommendations for Other Services       Precautions / Restrictions Precautions Precautions: Fall;Knee Required Braces or Orthoses: Other Brace/Splint Other Brace/Splint: Pt performed 5 SLR with 10% assist, then completed 5 SLR without assist.  No KI used this session.  No knee buckle into flexion, one episode of hyperextension with first step up with stair training.  Restrictions Weight Bearing Restrictions: Yes RLE Weight Bearing: Weight bearing as tolerated    Mobility  Bed Mobility Overal bed mobility: Modified Independent             General bed mobility comments: Increased time but no physical assist or cues needed  Transfers Overall transfer level: Needs assistance Equipment used: Rolling walker (2 wheeled) Transfers: Sit to/from Stand Sit to Stand: Min guard;From elevated surface         General transfer comment: Did need bed slightly elevated for first sit>stand.  Performed sit>stand from chair x2 following this.  Min guard for safety.  Cues to slide R foot back in preparation for sit>stand.  First stand>sit with poorly controlled descent which pt corrected with verbal cues.   Ambulation/Gait Ambulation/Gait assistance:  Min guard Gait Distance (Feet): 80 Feet Assistive device: Rolling walker (2 wheeled) Gait Pattern/deviations: Step-through pattern;Decreased stance time - right;Decreased weight shift to right;Decreased step length - left;Antalgic Gait velocity: decreased   General Gait Details: Pt quickly transitions to step through gait pattern without cues.  Pt WBing heavily through [place] on RW this session due to 7/10 pain.  Limited ambulatory distance due to pain.  Cues for improved R knee flexion during toe off.    Stairs Stairs: Yes Stairs assistance: Min guard Stair Management: Two rails;Forwards;Step to pattern Number of Stairs: 8(4x2) General stair comments: Pt demonstrated proper sequencing.  R knee hyperextension with first step but otherwise no instability noted.    Wheelchair Mobility    Modified Rankin (Stroke Patients Only)       Balance Overall balance assessment: Needs assistance Sitting-balance support: No upper extremity supported;Feet supported Sitting balance-Leahy Scale: Good     Standing balance support: No upper extremity supported;During functional activity Standing balance-Leahy Scale: Fair Standing balance comment: Pt able to stand statically without UE support but requires RW when ambulating.                             Cognition Arousal/Alertness: Awake/alert Behavior During Therapy: WFL for tasks assessed/performed Overall Cognitive Status: Within Functional Limits for tasks assessed  Exercises Total Joint Exercises Quad Sets: Strengthening;Right;10 reps;Supine Short Arc Quad: Strengthening;Right;10 reps;Supine;AAROM Hip ABduction/ADduction: Strengthening;Right;10 reps;Supine;AAROM Straight Leg Raises: AAROM;Strengthening;Right;10 reps;Supine Knee Flexion: AAROM;Right;Seated;Other reps (comment)(3 reps with 5 second holds) Goniometric ROM: 0-94 deg    General Comments        Pertinent  Vitals/Pain Pain Assessment: 0-10 Pain Score: 7  Pain Location: R knee Pain Descriptors / Indicators: Aching;Guarding;Tightness Pain Intervention(s): Limited activity within patient's tolerance;Monitored during session;Premedicated before session;Repositioned;Ice applied    Home Living                      Prior Function            PT Goals (current goals can now be found in the care plan section) Acute Rehab PT Goals Patient Stated Goal: decreased pain PT Goal Formulation: With patient Time For Goal Achievement: 01/09/18 Potential to Achieve Goals: Good Progress towards PT goals: Progressing toward goals    Frequency    BID      PT Plan Current plan remains appropriate    Co-evaluation              AM-PAC PT "6 Clicks" Daily Activity  Outcome Measure  Difficulty turning over in bed (including adjusting bedclothes, sheets and blankets)?: None Difficulty moving from lying on back to sitting on the side of the bed? : A Little Difficulty sitting down on and standing up from a chair with arms (e.g., wheelchair, bedside commode, etc,.)?: Unable Help needed moving to and from a bed to chair (including a wheelchair)?: A Little Help needed walking in hospital room?: A Little Help needed climbing 3-5 steps with a railing? : A Little 6 Click Score: 17    End of Session Equipment Utilized During Treatment: Gait belt Activity Tolerance: Patient tolerated treatment well Patient left: in chair;with call bell/phone within reach;with chair alarm set;Other (comment)(with polar care and bone foam in place) Nurse Communication: Mobility status PT Visit Diagnosis: Muscle weakness (generalized) (M62.81);Other abnormalities of gait and mobility (R26.89);Pain Pain - Right/Left: Right Pain - part of body: Knee     Time: 1224-8250 PT Time Calculation (min) (ACUTE ONLY): 51 min  Charges:  $Gait Training: 23-37 mins $Therapeutic Exercise: 8-22 mins                      Collie Siad PT, DPT 12/28/2017, 12:31 PM

## 2018-01-03 ENCOUNTER — Telehealth: Payer: Self-pay

## 2018-01-03 NOTE — Telephone Encounter (Signed)
EMMI Follow-up: I received a voice message from Benjamin Nicholson and wife, Benjamin Nicholson asking that the cell number be updated in the system to Benjamin Nicholson's cell number as she was receiving follow-up calls at work for him.  I talked with Benjamin Nicholson and confirmed this request and updated the demographic sheet to show his cell as primary number to call [phone]).  He shared with me, that  [facility] PT said he was ahead of schedule and should be able to transition to Outpatient PT next week and was doing well.  No other needs noted.

## 2018-08-04 ENCOUNTER — Other Ambulatory Visit: Payer: Self-pay | Admitting: Pharmacist

## 2019-04-12 IMAGING — DX DG KNEE 1-2V PORT*L*
2 series · 2 of 2 positions shown · non-contrast
Comparison: No recent prior .

CLINICAL DATA: Left knee replacement .

EXAM:
PORTABLE LEFT KNEE - 1-2 VIEW

[knee ap]
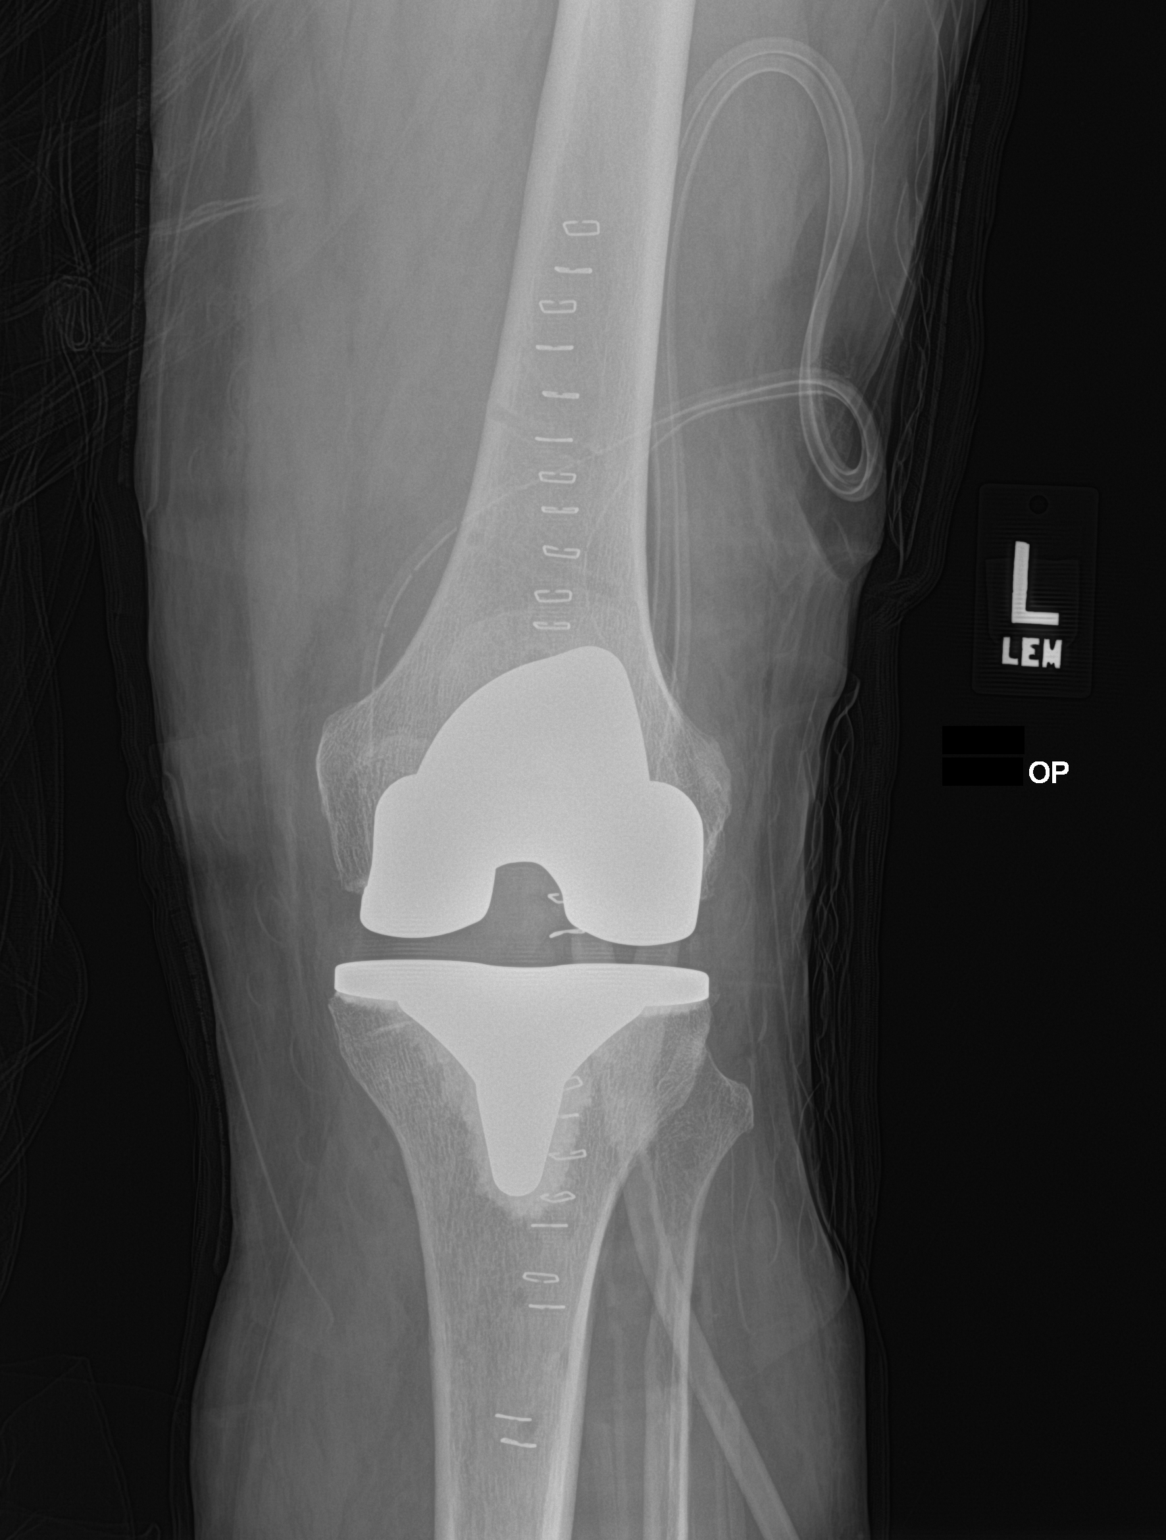

[knee lat]
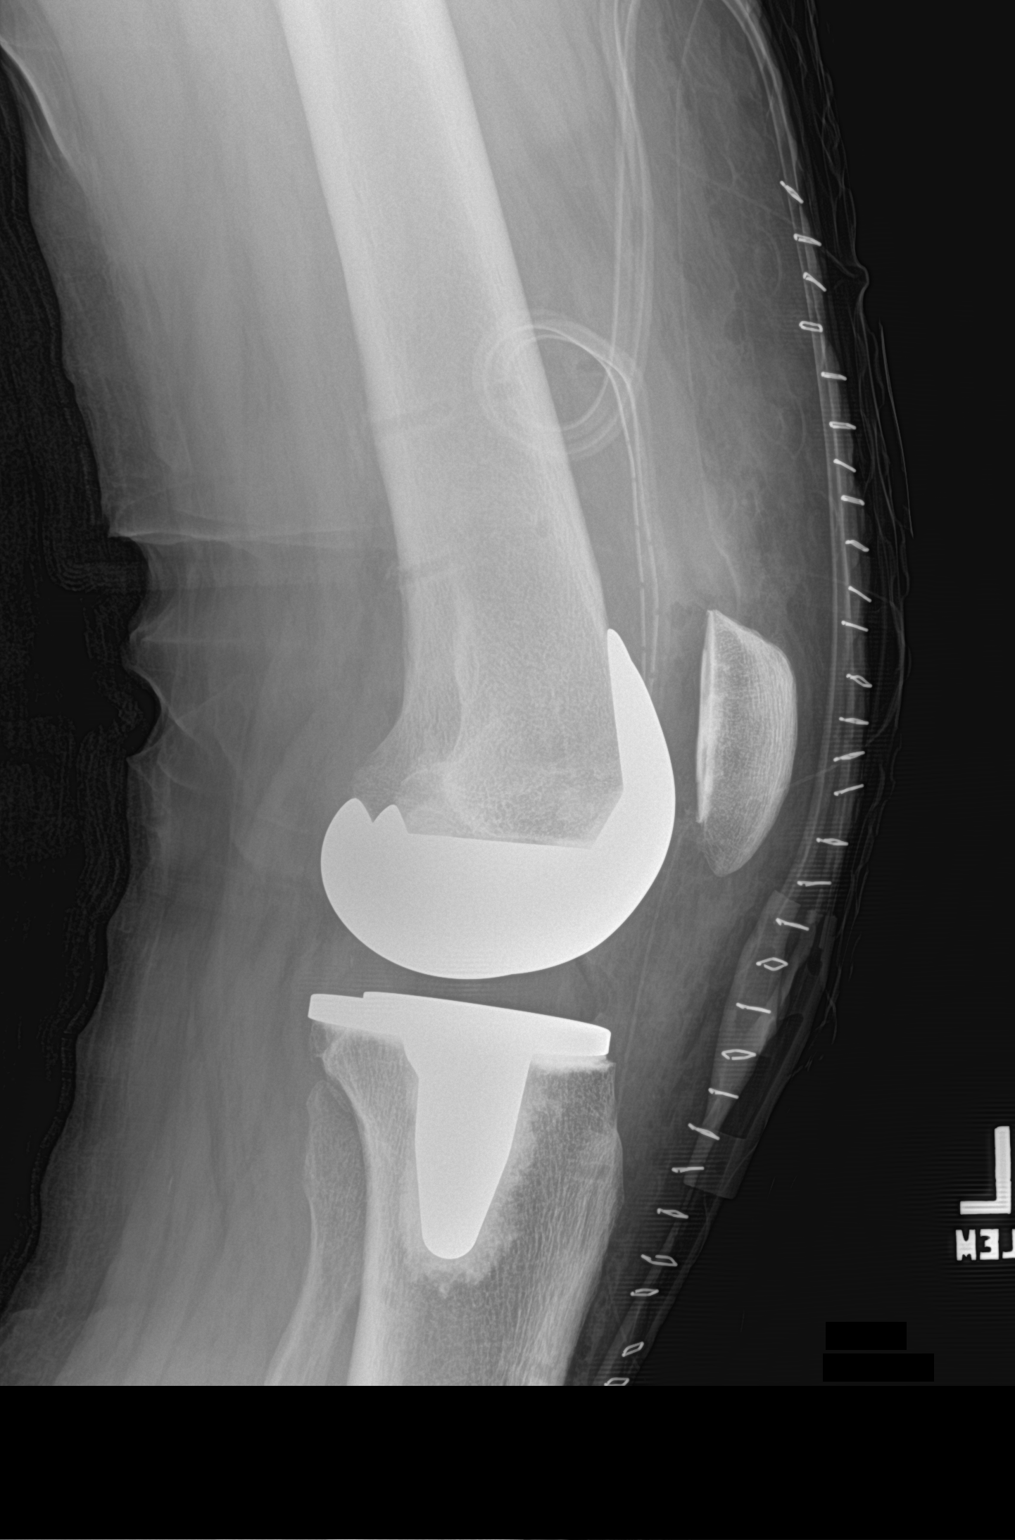

[2 of 2 positions shown; findings below may reference images not displayed]

FINDINGS: Total left knee replacement. Hardware intact. Anatomic alignment .
No acute abnormality.
IMPRESSION: Total left knee replacement .

## 2020-09-11 IMAGING — DX DG KNEE 1-2V PORT*R*
1 series · 1 of 1 positions shown · non-contrast
Comparison: 10/30/2009 MR.

CLINICAL DATA: 67-year-old male post knee replacement. Initial
encounter.

EXAM:
PORTABLE RIGHT KNEE - 1-2 VIEW

[knee ap]
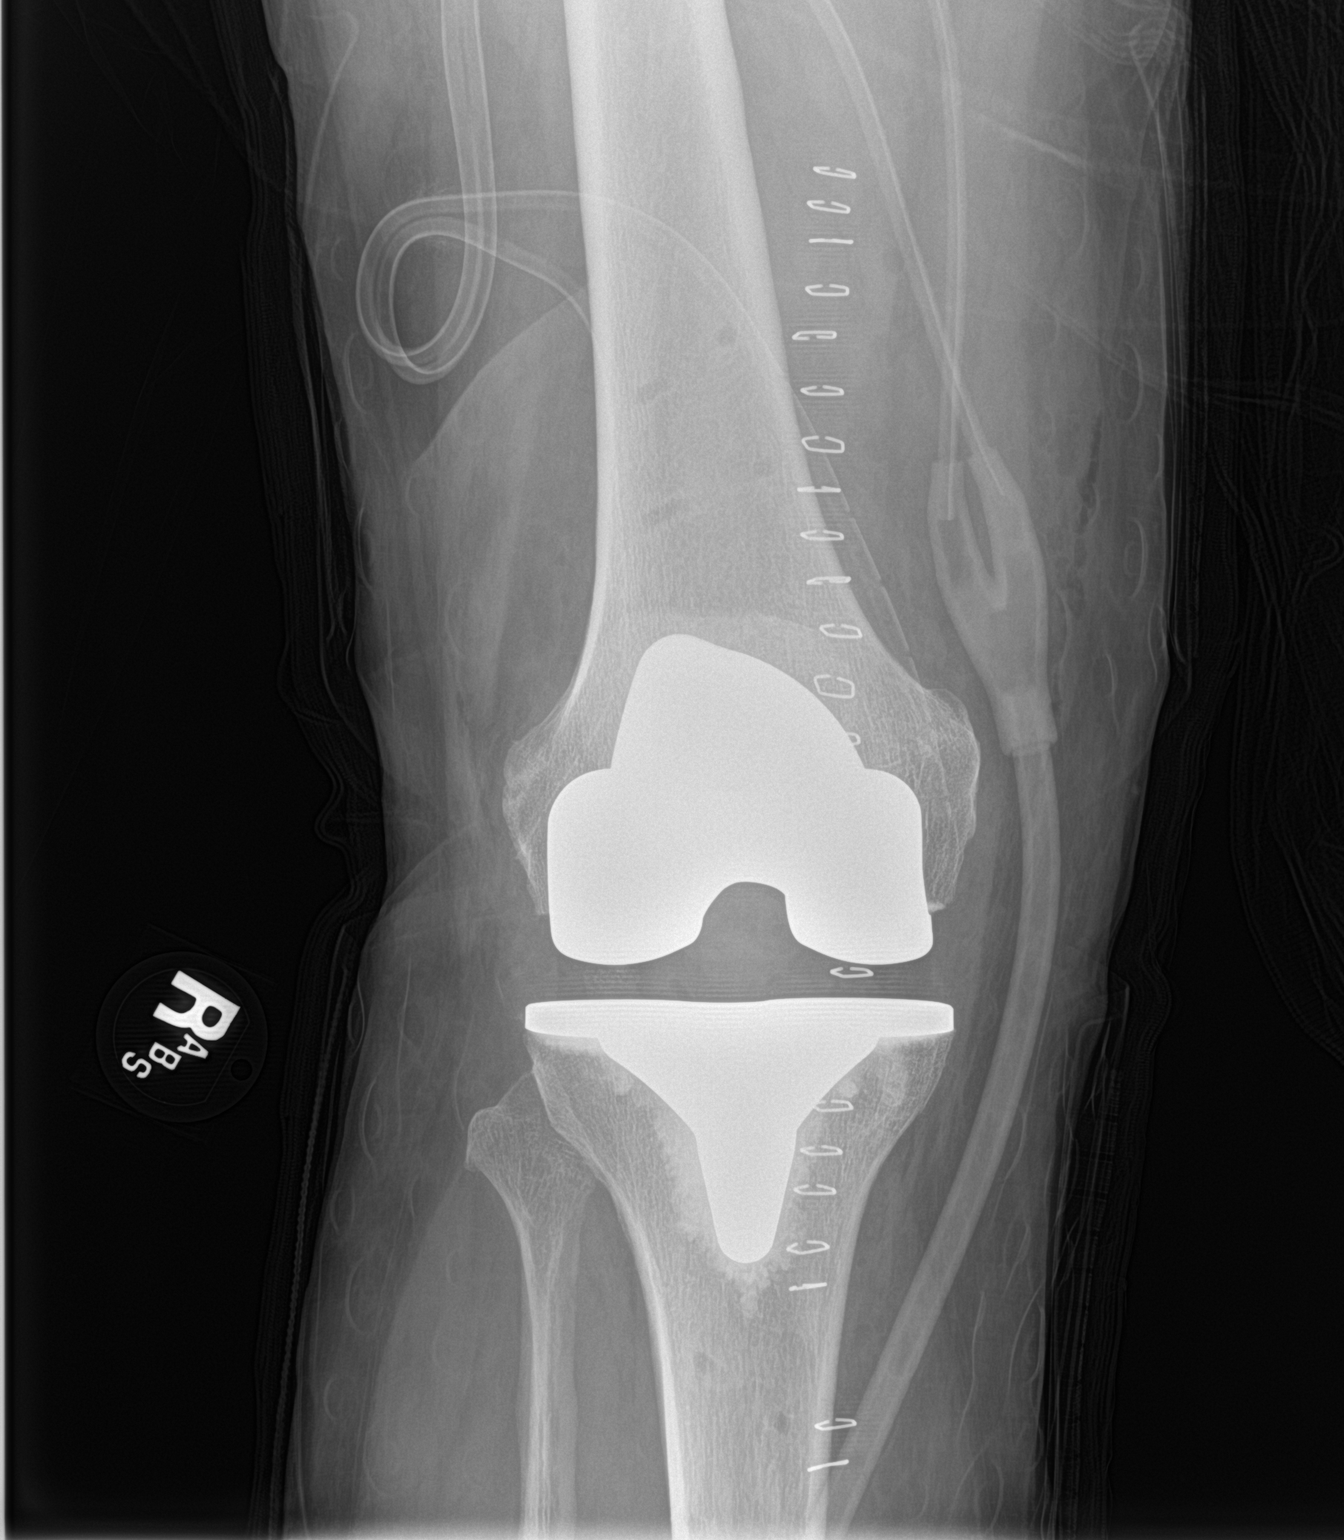

[1 of 1 positions shown; findings below may reference images not displayed]

FINDINGS: Post total right knee replacement which appears in satisfactory
position without complication noted. Pin tracks probably from prior
external fixation device distal femur proximal tibia. Drain in
place.
IMPRESSION: Post total right knee replacement.
